# Patient Record
Sex: Male | Born: 1950 | Race: White | Hispanic: No | Marital: Married | Smoking: Heavy tobacco smoker
Health system: Southern US, Community
[De-identification: ages and names within clinical notes are randomized; demographics above are authoritative.]

## PROBLEM LIST (undated history)

## (undated) DIAGNOSIS — R29898 Other symptoms and signs involving the musculoskeletal system: Secondary | ICD-10-CM

## (undated) DIAGNOSIS — M1990 Unspecified osteoarthritis, unspecified site: Secondary | ICD-10-CM

---

## 2016-11-13 ENCOUNTER — Emergency Department (HOSPITAL_COMMUNITY): Payer: Medicare Other

## 2016-11-13 ENCOUNTER — Inpatient Hospital Stay (HOSPITAL_COMMUNITY)
Admission: EM | Admit: 2016-11-13 | Discharge: 2016-11-18 | DRG: 372 | Disposition: A | Discharge status: Home / Self Care | Payer: Medicare Other | Attending: General Surgery | Admitting: General Surgery

## 2016-11-13 ENCOUNTER — Encounter (HOSPITAL_COMMUNITY): Payer: Self-pay | Admitting: Emergency Medicine

## 2016-11-13 DIAGNOSIS — F411 Generalized anxiety disorder: Secondary | ICD-10-CM | POA: Diagnosis present

## 2016-11-13 DIAGNOSIS — K30 Functional dyspepsia: Secondary | ICD-10-CM | POA: Diagnosis not present

## 2016-11-13 DIAGNOSIS — K668 Other specified disorders of peritoneum: Secondary | ICD-10-CM

## 2016-11-13 DIAGNOSIS — K352 Acute appendicitis with generalized peritonitis: Principal | ICD-10-CM | POA: Diagnosis present

## 2016-11-13 DIAGNOSIS — Z79891 Long term (current) use of opiate analgesic: Secondary | ICD-10-CM

## 2016-11-13 DIAGNOSIS — G8929 Other chronic pain: Secondary | ICD-10-CM | POA: Diagnosis present

## 2016-11-13 DIAGNOSIS — N179 Acute kidney failure, unspecified: Secondary | ICD-10-CM | POA: Diagnosis present

## 2016-11-13 DIAGNOSIS — I10 Essential (primary) hypertension: Secondary | ICD-10-CM | POA: Diagnosis present

## 2016-11-13 DIAGNOSIS — R109 Unspecified abdominal pain: Secondary | ICD-10-CM

## 2016-11-13 DIAGNOSIS — K567 Ileus, unspecified: Secondary | ICD-10-CM

## 2016-11-13 DIAGNOSIS — F17210 Nicotine dependence, cigarettes, uncomplicated: Secondary | ICD-10-CM | POA: Diagnosis present

## 2016-11-13 DIAGNOSIS — Z79899 Other long term (current) drug therapy: Secondary | ICD-10-CM

## 2016-11-13 DIAGNOSIS — Z7982 Long term (current) use of aspirin: Secondary | ICD-10-CM

## 2016-11-13 DIAGNOSIS — K219 Gastro-esophageal reflux disease without esophagitis: Secondary | ICD-10-CM | POA: Diagnosis present

## 2016-11-13 DIAGNOSIS — R1084 Generalized abdominal pain: Secondary | ICD-10-CM | POA: Diagnosis present

## 2016-11-13 DIAGNOSIS — K3532 Acute appendicitis with perforation and localized peritonitis, without abscess: Secondary | ICD-10-CM | POA: Diagnosis present

## 2016-11-13 DIAGNOSIS — M545 Low back pain: Secondary | ICD-10-CM | POA: Diagnosis present

## 2016-11-13 DIAGNOSIS — D72829 Elevated white blood cell count, unspecified: Secondary | ICD-10-CM

## 2016-11-13 DIAGNOSIS — Z885 Allergy status to narcotic agent status: Secondary | ICD-10-CM

## 2016-11-13 HISTORY — DX: Unspecified osteoarthritis, unspecified site: M19.90

## 2016-11-13 HISTORY — DX: Other symptoms and signs involving the musculoskeletal system: R29.898

## 2016-11-13 LAB — URINALYSIS, ROUTINE W REFLEX MICROSCOPIC
Glucose, UA: NEGATIVE mg/dL
HGB URINE DIPSTICK: NEGATIVE
Ketones, ur: 5 mg/dL — AB
Leukocytes, UA: NEGATIVE
Nitrite: NEGATIVE
PROTEIN: 100 mg/dL — AB
Specific Gravity, Urine: 1.035 — ABNORMAL HIGH (ref 1.005–1.030)
pH: 5 (ref 5.0–8.0)

## 2016-11-13 LAB — CBC WITH DIFFERENTIAL/PLATELET
BASOS PCT: 0 %
Basophils Absolute: 0 10*3/uL (ref 0.0–0.1)
EOS ABS: 0.1 10*3/uL (ref 0.0–0.7)
Eosinophils Relative: 1 %
HCT: 43.7 % (ref 39.0–52.0)
HEMOGLOBIN: 15.7 g/dL (ref 13.0–17.0)
Lymphocytes Relative: 4 %
Lymphs Abs: 0.9 10*3/uL (ref 0.7–4.0)
MCH: 33 pg (ref 26.0–34.0)
MCHC: 35.9 g/dL (ref 30.0–36.0)
MCV: 91.8 fL (ref 78.0–100.0)
Monocytes Absolute: 1 10*3/uL (ref 0.1–1.0)
Monocytes Relative: 5 %
NEUTROS PCT: 90 %
Neutro Abs: 18.6 10*3/uL — ABNORMAL HIGH (ref 1.7–7.7)
PLATELETS: 258 10*3/uL (ref 150–400)
RBC: 4.76 MIL/uL (ref 4.22–5.81)
RDW: 12.9 % (ref 11.5–15.5)
WBC: 20.6 10*3/uL — AB (ref 4.0–10.5)

## 2016-11-13 LAB — COMPREHENSIVE METABOLIC PANEL
ALK PHOS: 75 U/L (ref 38–126)
ALT: 14 U/L — ABNORMAL LOW (ref 17–63)
ANION GAP: 12 (ref 5–15)
AST: 17 U/L (ref 15–41)
Albumin: 3.9 g/dL (ref 3.5–5.0)
BILIRUBIN TOTAL: 0.9 mg/dL (ref 0.3–1.2)
BUN: 15 mg/dL (ref 6–20)
CALCIUM: 9.3 mg/dL (ref 8.9–10.3)
CO2: 27 mmol/L (ref 22–32)
Chloride: 97 mmol/L — ABNORMAL LOW (ref 101–111)
Creatinine, Ser: 1.28 mg/dL — ABNORMAL HIGH (ref 0.61–1.24)
GFR calc non Af Amer: 57 mL/min — ABNORMAL LOW (ref >60)
Glucose, Bld: 138 mg/dL — ABNORMAL HIGH (ref 65–99)
Potassium: 3.5 mmol/L (ref 3.5–5.1)
SODIUM: 136 mmol/L (ref 135–145)
TOTAL PROTEIN: 8.7 g/dL — AB (ref 6.5–8.1)

## 2016-11-13 LAB — TROPONIN I: Troponin I: <0.03 ng/mL (ref <0.03)

## 2016-11-13 LAB — LIPASE, BLOOD: LIPASE: 13 U/L (ref 11–51)

## 2016-11-13 MED ORDER — SODIUM CHLORIDE 0.9 % IV SOLN: INTRAVENOUS | Status: DC

## 2016-11-13 MED ORDER — ONDANSETRON HCL 4 MG/2ML IJ SOLN
4.0000 mg | INTRAMUSCULAR | Status: AC | PRN
  Administered 2016-11-13 – 2016-11-14 (×2): 4 mg via INTRAVENOUS
  Filled 2016-11-13 (×2): qty 2

## 2016-11-13 MED ORDER — IOPAMIDOL (ISOVUE-300) INJECTION 61%
100.0000 mL | Freq: Once | INTRAVENOUS | Status: AC | PRN
  Administered 2016-11-13: 100 mL via INTRAVENOUS

## 2016-11-13 MED ORDER — IOPAMIDOL (ISOVUE-300) INJECTION 61%
INTRAVENOUS | Status: AC
  Filled 2016-11-13: qty 30

## 2016-11-13 MED ORDER — SODIUM CHLORIDE 0.9 % IV BOLUS (SEPSIS)
500.0000 mL | Freq: Once | INTRAVENOUS | Status: AC
  Administered 2016-11-13: 500 mL via INTRAVENOUS

## 2016-11-13 MED ORDER — FENTANYL CITRATE (PF) 100 MCG/2ML IJ SOLN
50.0000 ug | INTRAMUSCULAR | Status: AC | PRN
  Administered 2016-11-13 – 2016-11-14 (×2): 50 ug via INTRAVENOUS
  Filled 2016-11-13 (×2): qty 2

## 2016-11-13 NOTE — ED Notes (Addendum)
Patient states that he has chronic neck and back pain. Patient states that he is not able to void at this time.

## 2016-11-13 NOTE — ED Triage Notes (Signed)
abd pain since weds- He states that he is on pain meds

## 2016-11-13 NOTE — ED Provider Notes (Signed)
[facility] DEPT Provider Note   CSN: [id number] Arrival date & time: 11/13/16  2032     History   Chief Complaint Chief Complaint  Patient presents with  . Abdominal Pain    "all the way across and all the way up"    HPI Garcia Dalzell is a 66 y.o. male.   Abdominal Pain      Pt was seen at 2145.  Per pt, c/o gradual onset and persistence of constant generalized abd "pain" for the past 3 days.  Has been associated with decreased PO intake.  Describes the abd pain as "bloating."  Denies N/V, no diarrhea, no fevers, no back pain, no rash, no CP/SOB, no black or blood in stools.        Past Medical History:  Diagnosis Date  . Arthritis   . Back complaints     There are no active problems to display for this patient.   History reviewed. No pertinent surgical history.     Home Medications    Prior to Admission medications   Medication Sig Start Date End Date Taking? Authorizing Provider  ALPRAZolam Prudy Feeler) 0.5 MG tablet Take 0.5 mg by mouth 4 (four) times daily.   Yes Historical Provider, MD  amLODipine (NORVASC) 10 MG tablet Take 5 mg by mouth daily.   Yes Historical Provider, MD  aspirin EC 81 MG tablet Take 81 mg by mouth daily.   Yes Historical Provider, MD  cholecalciferol (VITAMIN D) 1000 units tablet Take 2,000 Units by mouth daily.   Yes Historical Provider, MD  cyclobenzaprine (FLEXERIL) 10 MG tablet Take 10 mg by mouth 2 (two) times daily.   Yes Historical Provider, MD  docusate sodium (COLACE) 100 MG capsule Take 100-300 mg by mouth daily as needed for mild constipation.   Yes Historical Provider, MD  DULoxetine (CYMBALTA) 60 MG capsule Take 60 mg by mouth daily.   Yes Historical Provider, MD  L-Lysine 500 MG TABS Take 1 tablet by mouth daily.   Yes Historical Provider, MD  morphine (KADIAN) 60 MG 24 hr capsule Take 60 mg by mouth 3 (three) times daily.   Yes Historical Provider, MD  omeprazole (PRILOSEC) 20 MG capsule Take 20 mg by mouth 2 (two)  times daily before a meal.   Yes Historical Provider, MD  oxyCODONE-acetaminophen (PERCOCET/ROXICET) 5-325 MG tablet Take by mouth every 6 (six) hours as needed for severe pain.   Yes Historical Provider, MD    Family History No family history on file.  Social History Social History  Substance Use Topics  . Smoking status: Heavy Tobacco Smoker    Packs/day: 1.00    Types: Cigarettes  . Smokeless tobacco: Never Used  . Alcohol use Yes     Allergies   Codeine   Review of Systems Review of Systems  Gastrointestinal: Positive for abdominal pain.  ROS: Statement: All systems negative except as marked or noted in the HPI; Constitutional: Negative for fever and chills. ; ; Eyes: Negative for eye pain, redness and discharge. ; ; ENMT: Negative for ear pain, hoarseness, nasal congestion, sinus pressure and sore throat. ; ; Cardiovascular: Negative for chest pain, palpitations, diaphoresis, dyspnea and peripheral edema. ; ; Respiratory: Negative for cough, wheezing and stridor. ; ; Gastrointestinal: +abd pain. Negative for nausea, vomiting, diarrhea, blood in stool, hematemesis, jaundice and rectal bleeding. . ; ; Genitourinary: Negative for dysuria, flank pain and hematuria. ; ; Musculoskeletal: Negative for change in chronic back pain and neck pain. Negative for swelling  and trauma.; ; Skin: Negative for pruritus, rash, abrasions, blisters, bruising and skin lesion.; ; Neuro: Negative for headache, lightheadedness and neck stiffness. Negative for weakness, altered level of consciousness, altered mental status, extremity weakness, paresthesias, involuntary movement, seizure and syncope.      Physical Exam Updated Vital Signs BP 145/80 (BP Location: Left Arm)   Pulse 100   Temp 99 F (37.2 C) (Oral)   Resp 18   Ht 5\' 11"  (1.803 m)   Wt 210 lb (95.3 kg)   SpO2 92%   BMI 29.29 kg/m   Physical Exam 2150: Physical examination:  Nursing notes reviewed; Vital signs and O2 SAT reviewed;   Constitutional: Well developed, Well nourished, Well hydrated, Uncomfortable appearing; Head:  Normocephalic, atraumatic; Eyes: EOMI, PERRL, No scleral icterus; ENMT: Mouth and pharynx normal, Mucous membranes moist; Neck: Supple, Full range of motion, No lymphadenopathy; Cardiovascular: Regular rate and rhythm, No gallop; Respiratory: Breath sounds clear & equal bilaterally, No wheezes.  Speaking full sentences with ease, Normal respiratory effort/excursion; Chest: Nontender, Movement normal; Abdomen: Soft, +diffuse tenderness to palp. +voluntary guarding. Nondistended, Normal bowel sounds; Genitourinary: No CVA tenderness; Extremities: Pulses normal, No tenderness, No edema, No calf edema or asymmetry.; Neuro: AA&Ox3, Major CN grossly intact.  Speech clear. No gross focal motor or sensory deficits in extremities.; Skin: Color normal, Warm, Dry.   ED Treatments / Results  Labs (all labs ordered are listed, but only abnormal results are displayed)   EKG  EKG Interpretation  Date/Time:  Saturday November 13 2016 21:59:45 EST Ventricular Rate:  98 PR Interval:    QRS Duration: 95 QT Interval:  320 QTC Calculation: 409 R Axis:   70 Text Interpretation:  Sinus rhythm Consider right atrial enlargement No old tracing to compare Confirmed by [facility]  MD, Nicholos Johns [phone]) on 11/13/2016 10:18:40 PM       Radiology   Procedures Procedures (including critical care time)  Medications Ordered in ED Medications  ondansetron (ZOFRAN) injection 4 mg (not administered)  fentaNYL (SUBLIMAZE) injection 50 mcg (not administered)  sodium chloride 0.9 % bolus 500 mL (not administered)  0.9 %  sodium chloride infusion (not administered)     Initial Impression / Assessment and Plan / ED Course  I have reviewed the triage vital signs and the nursing notes.  Pertinent labs & imaging results that were available during my care of the patient were reviewed by me and considered in my medical decision making  (see chart for details).  MDM Reviewed: previous chart, nursing note and vitals Reviewed previous: labs Interpretation: labs, ECG, x-ray and CT scan   Results for orders placed or performed during the hospital encounter of 11/13/16  Comprehensive metabolic panel  Result Value Ref Range   Sodium 136 135 - 145 mmol/L   Potassium 3.5 3.5 - 5.1 mmol/L   Chloride 97 (L) 101 - 111 mmol/L   CO2 27 22 - 32 mmol/L   Glucose, Bld 138 (H) 65 - 99 mg/dL   BUN 15 6 - 20 mg/dL   Creatinine, Ser 6.04 (H) 0.61 - 1.24 mg/dL   Calcium 9.3 8.9 - 54.0 mg/dL   Total Protein 8.7 (H) 6.5 - 8.1 g/dL   Albumin 3.9 3.5 - 5.0 g/dL   AST 17 15 - 41 U/L   ALT 14 (L) 17 - 63 U/L   Alkaline Phosphatase 75 38 - 126 U/L   Total Bilirubin 0.9 0.3 - 1.2 mg/dL   GFR calc non Af Amer 57 (L) >60 mL/min  GFR calc Af Amer >60 >60 mL/min   Anion gap 12 5 - 15  Lipase, blood  Result Value Ref Range   Lipase 13 11 - 51 U/L  CBC with Differential  Result Value Ref Range   WBC 20.6 (H) 4.0 - 10.5 K/uL   RBC 4.76 4.22 - 5.81 MIL/uL   Hemoglobin 15.7 13.0 - 17.0 g/dL   HCT 40.9 81.1 - 91.4 %   MCV 91.8 78.0 - 100.0 fL   MCH 33.0 26.0 - 34.0 pg   MCHC 35.9 30.0 - 36.0 g/dL   RDW 78.2 95.6 - 21.3 %   Platelets 258 150 - 400 K/uL   Neutrophils Relative % 90 %   Neutro Abs 18.6 (H) 1.7 - 7.7 K/uL   Lymphocytes Relative 4 %   Lymphs Abs 0.9 0.7 - 4.0 K/uL   Monocytes Relative 5 %   Monocytes Absolute 1.0 0.1 - 1.0 K/uL   Eosinophils Relative 1 %   Eosinophils Absolute 0.1 0.0 - 0.7 K/uL   Basophils Relative 0 %   Basophils Absolute 0.0 0.0 - 0.1 K/uL  Troponin I  Result Value Ref Range   Troponin I <0.03 <0.03 ng/mL  Urinalysis, Routine w reflex microscopic  Result Value Ref Range   Color, Urine AMBER (A) YELLOW   APPearance HAZY (A) CLEAR   Specific Gravity, Urine 1.035 (H) 1.005 - 1.030   pH 5.0 5.0 - 8.0   Glucose, UA NEGATIVE NEGATIVE mg/dL   Hgb urine dipstick NEGATIVE NEGATIVE   Bilirubin Urine  SMALL (A) NEGATIVE   Ketones, ur 5 (A) NEGATIVE mg/dL   Protein, ur 086 (A) NEGATIVE mg/dL   Nitrite NEGATIVE NEGATIVE   Leukocytes, UA NEGATIVE NEGATIVE   RBC / HPF 0-5 0 - 5 RBC/hpf   WBC, UA 0-5 0 - 5 WBC/hpf   Bacteria, UA RARE (A) NONE SEEN   Mucous PRESENT    Granular Casts, UA PRESENT    Dg Chest 2 View Result Date: 11/13/2016 CLINICAL DATA:  Abdominal pain extending into chest 3 days. EXAM: CHEST  2 VIEW COMPARISON:  None. FINDINGS: Lungs are adequately inflated without focal consolidation or effusion. Cardiomediastinal silhouette is within normal. There is mild degenerate change of the spine. IMPRESSION: No active cardiopulmonary disease. Electronically Signed   By: Elberta Fortis M.D.   On: 11/13/2016 21:59   Ct Abdomen Pelvis W Contrast Result Date: 11/14/2016 CLINICAL DATA:  66 year old male with abdominal pain. EXAM: CT ABDOMEN AND PELVIS WITH CONTRAST TECHNIQUE: Multidetector CT imaging of the abdomen and pelvis was performed using the standard protocol following bolus administration of intravenous contrast. CONTRAST:  ISOVUE-300 IOPAMIDOL (ISOVUE-300) INJECTION 61% COMPARISON:  None. FINDINGS: Lower chest: The visualized lung bases are clear. Scattered pockets of free air noted in the upper abdomen and within the pelvis. Small subhepatic free fluid. Hepatobiliary: Apparent fatty infiltration of the liver. No intrahepatic biliary ductal dilatation. The gallbladder is distended. No calcified gallstones noted. Pancreas: Unremarkable. No pancreatic ductal dilatation or surrounding inflammatory changes. Spleen: Normal in size without focal abnormality. Adrenals/Urinary Tract: The adrenal glands, kidneys, and the visualized ureters appear unremarkable. The urinary bladder is predominantly collapsed. There is diffuse thickened and hazy appearance of the bladder wall likely combination of underdistention and reactive inflammation. Correlation with urinalysis recommended to exclude  cystitis. Stomach/Bowel: There is inflammatory changes with pockets of extraluminal air centered in the right lower quadrant in the region of the appendix. The appendix is only partially visualized. The base and proximal  portion of the appendix appear unremarkable. The distal portion of the appendix extends into the inflammatory process cannot well visualized. The constellation of findings most suspicious for a ruptured acute appendicitis. Multiple small loculated fluid collections noted in the right lower quadrant. No discrete drainable fluid collection with organized wall identified at this time. Thickened and inflamed loops of small bowel in the right hemipelvis likely reactive inflammation. Multiple mildly dilated fluid-filled loops of small bowel throughout the abdomen most consistent with ileus. There is sigmoid diverticulosis with muscular hypertrophy. No active inflammatory changes. No definite evidence of bowel obstruction Vascular/Lymphatic: There is mild aortoiliac atherosclerotic disease. The origins of the celiac axis, SMA, IMA as well as the origins of the renal arteries are patent. The SMV, splenic vein, and main portal vein are patent. No portal venous gas identified. Mildly enlarged peripancreatic and periportal lymph nodes, nonspecific, likely reactive. Reproductive: The prostate and seminal vesicles are grossly unremarkable. Other: Small fat containing umbilical hernia. Musculoskeletal: Bilateral L5 pars defects with grade 1 L5-S1 anterolisthesis. Mild multilevel degenerative changes. No acute fracture. IMPRESSION: Findings most consistent with perforated acute appendicitis with pneumoperitoneum and small free fluid. Multiple small loculated fluid collection in the region of the appendix noted. No drainable fluid collection identified at this time. Clinical correlation follow-up recommended. Thickening of the small bowel in the right lower abdomen as well as mild dilatation of the loops of small  bowel most consistent with reactive enteritis and ileus. Sigmoid diverticulosis. These results were called by telephone at the time of interpretation on 11/14/2016 at 12:05 am to Dr. Samuel JesterKATHLEEN Hali Balgobin , who verbally acknowledged these results. Electronically Signed   By: Elgie CollardArash  Radparvar M.D.   On: 11/14/2016 00:07     0020:   CT scan as above. IV zosyn ordered. Dx and testing d/w pt and family.  Questions answered.  Verb understanding, agreeable to admit. T/C to General Surgery Dr. Lovell SheehanJenkins, case discussed, including:  HPI, pertinent PM/SHx, VS/PE, dx testing, ED course and treatment:  Pt does not need to go to OR emergently, start IV abx, admit to [facility], will consult. T/C to [facility] Dr. Onalee Huaavid, case discussed, including:  HPI, pertinent PM/SHx, VS/PE, dx testing, ED course and treatment:  Agreeable to admit, requests to write temporary orders, obtain medical bed to team [facility].   Final Clinical Impressions(s) / ED Diagnoses   Final diagnoses:  None    New Prescriptions New Prescriptions   No medications on file      Samuel JesterKathleen Jacarra Bobak, DO 11/16/16 1210

## 2016-11-14 DIAGNOSIS — F17210 Nicotine dependence, cigarettes, uncomplicated: Secondary | ICD-10-CM | POA: Diagnosis present

## 2016-11-14 DIAGNOSIS — G8929 Other chronic pain: Secondary | ICD-10-CM | POA: Diagnosis present

## 2016-11-14 DIAGNOSIS — K219 Gastro-esophageal reflux disease without esophagitis: Secondary | ICD-10-CM | POA: Diagnosis present

## 2016-11-14 DIAGNOSIS — Z885 Allergy status to narcotic agent status: Secondary | ICD-10-CM | POA: Diagnosis not present

## 2016-11-14 DIAGNOSIS — N179 Acute kidney failure, unspecified: Secondary | ICD-10-CM | POA: Diagnosis not present

## 2016-11-14 DIAGNOSIS — D72829 Elevated white blood cell count, unspecified: Secondary | ICD-10-CM | POA: Diagnosis not present

## 2016-11-14 DIAGNOSIS — K3532 Acute appendicitis with perforation and localized peritonitis, without abscess: Secondary | ICD-10-CM | POA: Diagnosis present

## 2016-11-14 DIAGNOSIS — K30 Functional dyspepsia: Secondary | ICD-10-CM | POA: Diagnosis not present

## 2016-11-14 DIAGNOSIS — F411 Generalized anxiety disorder: Secondary | ICD-10-CM | POA: Diagnosis present

## 2016-11-14 DIAGNOSIS — K352 Acute appendicitis with generalized peritonitis: Principal | ICD-10-CM

## 2016-11-14 DIAGNOSIS — R1084 Generalized abdominal pain: Secondary | ICD-10-CM | POA: Diagnosis present

## 2016-11-14 DIAGNOSIS — M545 Low back pain: Secondary | ICD-10-CM | POA: Diagnosis present

## 2016-11-14 DIAGNOSIS — Z79899 Other long term (current) drug therapy: Secondary | ICD-10-CM | POA: Diagnosis not present

## 2016-11-14 DIAGNOSIS — Z7982 Long term (current) use of aspirin: Secondary | ICD-10-CM | POA: Diagnosis not present

## 2016-11-14 DIAGNOSIS — I10 Essential (primary) hypertension: Secondary | ICD-10-CM | POA: Diagnosis present

## 2016-11-14 DIAGNOSIS — Z79891 Long term (current) use of opiate analgesic: Secondary | ICD-10-CM | POA: Diagnosis not present

## 2016-11-14 LAB — BASIC METABOLIC PANEL
Anion gap: 10 (ref 5–15)
BUN: 14 mg/dL (ref 6–20)
CHLORIDE: 98 mmol/L — AB (ref 101–111)
CO2: 26 mmol/L (ref 22–32)
CREATININE: 1.12 mg/dL (ref 0.61–1.24)
Calcium: 8.6 mg/dL — ABNORMAL LOW (ref 8.9–10.3)
GFR calc Af Amer: >60 mL/min (ref >60)
GFR calc non Af Amer: >60 mL/min (ref >60)
GLUCOSE: 127 mg/dL — AB (ref 65–99)
POTASSIUM: 3.8 mmol/L (ref 3.5–5.1)
SODIUM: 134 mmol/L — AB (ref 135–145)

## 2016-11-14 LAB — CBC
HEMATOCRIT: 38.9 % — AB (ref 39.0–52.0)
Hemoglobin: 14.1 g/dL (ref 13.0–17.0)
MCH: 33.4 pg (ref 26.0–34.0)
MCHC: 36.2 g/dL — AB (ref 30.0–36.0)
MCV: 92.2 fL (ref 78.0–100.0)
PLATELETS: 235 10*3/uL (ref 150–400)
RBC: 4.22 MIL/uL (ref 4.22–5.81)
RDW: 13 % (ref 11.5–15.5)
WBC: 16 10*3/uL — AB (ref 4.0–10.5)

## 2016-11-14 LAB — PROTIME-INR
INR: 1.11
Prothrombin Time: 14.4 seconds (ref 11.4–15.2)

## 2016-11-14 LAB — SURGICAL PCR SCREEN
MRSA, PCR: NEGATIVE
Staphylococcus aureus: NEGATIVE

## 2016-11-14 LAB — LACTIC ACID, PLASMA
Lactic Acid, Venous: 1 mmol/L (ref 0.5–1.9)
Lactic Acid, Venous: 1 mmol/L (ref 0.5–1.9)

## 2016-11-14 MED ORDER — LACTATED RINGERS IV SOLN
INTRAVENOUS | Status: DC
  Administered 2016-11-14: 125 mL/h via INTRAVENOUS
  Administered 2016-11-15: 22:00:00 via INTRAVENOUS

## 2016-11-14 MED ORDER — ONDANSETRON HCL 4 MG/2ML IJ SOLN: 4.0000 mg | Freq: Three times a day (TID) | INTRAMUSCULAR | Status: AC | PRN

## 2016-11-14 MED ORDER — ONDANSETRON HCL 4 MG PO TABS: 4.0000 mg | ORAL_TABLET | Freq: Four times a day (QID) | ORAL | Status: DC | PRN

## 2016-11-14 MED ORDER — NICOTINE 21 MG/24HR TD PT24
21.0000 mg | MEDICATED_PATCH | Freq: Every day | TRANSDERMAL | Status: DC
  Administered 2016-11-14 – 2016-11-18 (×5): 21 mg via TRANSDERMAL
  Filled 2016-11-14 (×5): qty 1

## 2016-11-14 MED ORDER — HYDROMORPHONE HCL 1 MG/ML IJ SOLN
1.0000 mg | INTRAMUSCULAR | Status: DC | PRN
  Administered 2016-11-14 – 2016-11-15 (×7): 1 mg via INTRAVENOUS
  Filled 2016-11-14 (×7): qty 1

## 2016-11-14 MED ORDER — PIPERACILLIN-TAZOBACTAM 3.375 G IVPB
3.3750 g | Freq: Three times a day (TID) | INTRAVENOUS | Status: DC
  Administered 2016-11-14 – 2016-11-18 (×12): 3.375 g via INTRAVENOUS
  Filled 2016-11-14 (×10): qty 50

## 2016-11-14 MED ORDER — LORAZEPAM 2 MG/ML IJ SOLN
1.0000 mg | INTRAMUSCULAR | Status: DC | PRN
  Administered 2016-11-14 – 2016-11-17 (×9): 1 mg via INTRAVENOUS
  Filled 2016-11-14 (×9): qty 1

## 2016-11-14 MED ORDER — ONDANSETRON HCL 4 MG/2ML IJ SOLN
4.0000 mg | Freq: Four times a day (QID) | INTRAMUSCULAR | Status: DC | PRN
  Administered 2016-11-15 (×2): 4 mg via INTRAVENOUS
  Filled 2016-11-14 (×2): qty 2

## 2016-11-14 MED ORDER — MORPHINE SULFATE (PF) 4 MG/ML IV SOLN
4.0000 mg | INTRAVENOUS | Status: DC | PRN
  Administered 2016-11-14 (×2): 4 mg via INTRAVENOUS
  Filled 2016-11-14 (×3): qty 1

## 2016-11-14 MED ORDER — PIPERACILLIN-TAZOBACTAM 3.375 G IVPB 30 MIN
3.3750 g | Freq: Once | INTRAVENOUS | Status: AC
  Administered 2016-11-14: 3.375 g via INTRAVENOUS
  Filled 2016-11-14: qty 50

## 2016-11-14 MED ORDER — SODIUM CHLORIDE 0.9 % IV SOLN: INTRAVENOUS | Status: AC

## 2016-11-14 NOTE — H&P (Signed)
History and Physical    Daniel [name] ZOX:[id number] DOB: 11/04/1950 DOA: 11/13/2016  PCP: [facility]  Patient coming from: home  Chief Complaint:  Abdominal pain  HPI: Daniel [name] is a 66 y.o. male with medical history significant of chronic back pain, arthritis comes in with 3-4 days of abdominal pain.  Pt reports the pain was really bad at first, but then seemed to get better for a day or so then got worse again.  The pain is generalized and is worse with any movement.  He denies any fevers.  No n/v/d.  He thought he was constipated so he gave himself an enema yesterday and although he did have a BM it did not make him feel better.  Pt found to have perforated appendix.  Dr. Richrd Prime in ED called general surgery dr Lovell Sheehan and discussed case and will see pt in the am.    Review of Systems: As per HPI otherwise 10 point review of systems negative.   Past Medical History:  Diagnosis Date  . Arthritis   . Back complaints     History reviewed. No pertinent surgical history.   reports that he has been smoking Cigarettes.  He has been smoking about 1.00 pack per day. He has never used smokeless tobacco. He reports that he drinks alcohol. He reports that he does not use drugs.  Allergies  Allergen Reactions  . Codeine Hives    No family history on file. no premature CAD  Prior to Admission medications   Medication Sig Start Date End Date Taking? Authorizing Provider  ALPRAZolam Prudy Feeler) 0.5 MG tablet Take 0.5 mg by mouth 4 (four) times daily.   Yes Historical Provider, MD  amLODipine (NORVASC) 10 MG tablet Take 5 mg by mouth daily.   Yes Historical Provider, MD  aspirin EC 81 MG tablet Take 81 mg by mouth daily.   Yes Historical Provider, MD  cholecalciferol (VITAMIN D) 1000 units tablet Take 2,000 Units by mouth daily.   Yes Historical Provider, MD  cyclobenzaprine (FLEXERIL) 10 MG tablet Take 10 mg by mouth 2 (two) times daily.   Yes Historical Provider, MD    docusate sodium (COLACE) 100 MG capsule Take 100-300 mg by mouth daily as needed for mild constipation.   Yes Historical Provider, MD  DULoxetine (CYMBALTA) 60 MG capsule Take 60 mg by mouth daily.   Yes Historical Provider, MD  L-Lysine 500 MG TABS Take 1 tablet by mouth daily.   Yes Historical Provider, MD  morphine (KADIAN) 60 MG 24 hr capsule Take 60 mg by mouth 3 (three) times daily.   Yes Historical Provider, MD  omeprazole (PRILOSEC) 20 MG capsule Take 20 mg by mouth 2 (two) times daily before a meal.   Yes Historical Provider, MD  oxyCODONE-acetaminophen (PERCOCET/ROXICET) 5-325 MG tablet Take by mouth every 6 (six) hours as needed for severe pain.   Yes Historical Provider, MD    Physical Exam: Vitals:   11/13/16 2201 11/13/16 2215 11/13/16 2230 11/14/16 0029  BP: 145/80  126/78   Pulse: 100 96  110  Resp: 18 18 21 20   Temp:    99.7 F (37.6 C)  TempSrc:    Oral  SpO2: 92% 94%  96%  Weight:      Height:        Constitutional: NAD, calm, comfortable Vitals:   11/13/16 2201 11/13/16 2215 11/13/16 2230 11/14/16 0029  BP: 145/80  126/78   Pulse: 100 96  110  Resp: 18  18 21 20   Temp:    99.7 F (37.6 C)  TempSrc:    Oral  SpO2: 92% 94%  96%  Weight:      Height:       Eyes: PERRL, lids and conjunctivae normal ENMT: Mucous membranes are moist. Posterior pharynx clear of any exudate or lesions.Normal dentition.  Neck: normal, supple, no masses, no thyromegaly Respiratory: clear to auscultation bilaterally, no wheezing, no crackles. Normal respiratory effort. No accessory muscle use.  Cardiovascular: Regular rate and rhythm, no murmurs / rubs / gallops. No extremity edema. 2+ pedal pulses. No carotid bruits.  Abdomen: diffuse tenderness with rebound and guarding, no masses palpated. No hepatosplenomegaly. Bowel sounds decreased.  Musculoskeletal: no clubbing / cyanosis. No joint deformity upper and lower extremities. Good ROM, no contractures. Normal muscle tone.  Skin:  no rashes, lesions, ulcers. No induration Neurologic: CN 2-12 grossly intact. Sensation intact, DTR normal. Strength 5/5 in all 4.  Psychiatric: Normal judgment and insight. Alert and oriented x 3. Normal mood.    Labs on Admission: I have personally reviewed following labs and imaging studies  CBC:  Recent Labs Lab 11/13/16 2136  WBC 20.6*  NEUTROABS 18.6*  HGB 15.7  HCT 43.7  MCV 91.8  PLT 258   Basic Metabolic Panel:  Recent Labs Lab 11/13/16 2136  NA 136  K 3.5  CL 97*  CO2 27  GLUCOSE 138*  BUN 15  CREATININE 1.28*  CALCIUM 9.3   GFR: Estimated Creatinine Clearance: 67.8 mL/min (by C-G formula based on SCr of 1.28 mg/dL (H)). Liver Function Tests:  Recent Labs Lab 11/13/16 2136  AST 17  ALT 14*  ALKPHOS 75  BILITOT 0.9  PROT 8.7*  ALBUMIN 3.9    Recent Labs Lab 11/13/16 2136  LIPASE 13    Cardiac Enzymes:  Recent Labs Lab 11/13/16 2136  TROPONINI <0.03    Urine analysis:    Component Value Date/Time   COLORURINE AMBER (A) 11/13/2016 2328   APPEARANCEUR HAZY (A) 11/13/2016 2328   LABSPEC 1.035 (H) 11/13/2016 2328   PHURINE 5.0 11/13/2016 2328   GLUCOSEU NEGATIVE 11/13/2016 2328   HGBUR NEGATIVE 11/13/2016 2328   BILIRUBINUR SMALL (A) 11/13/2016 2328   KETONESUR 5 (A) 11/13/2016 2328   PROTEINUR 100 (A) 11/13/2016 2328   NITRITE NEGATIVE 11/13/2016 2328   LEUKOCYTESUR NEGATIVE 11/13/2016 2328     Radiological Exams on Admission: Dg Chest 2 View  Result Date: 11/13/2016 CLINICAL DATA:  Abdominal pain extending into chest 3 days. EXAM: CHEST  2 VIEW COMPARISON:  None. FINDINGS: Lungs are adequately inflated without focal consolidation or effusion. Cardiomediastinal silhouette is within normal. There is mild degenerate change of the spine. IMPRESSION: No active cardiopulmonary disease. Electronically Signed   By: Elberta Fortisaniel  Boyle M.D.   On: 11/13/2016 21:59   Ct Abdomen Pelvis W Contrast  Result Date: 11/14/2016 CLINICAL DATA:   66 year old male with abdominal pain. EXAM: CT ABDOMEN AND PELVIS WITH CONTRAST TECHNIQUE: Multidetector CT imaging of the abdomen and pelvis was performed using the standard protocol following bolus administration of intravenous contrast. CONTRAST:  100mL ISOVUE-300 IOPAMIDOL (ISOVUE-300) INJECTION 61% COMPARISON:  None. FINDINGS: Lower chest: The visualized lung bases are clear. Scattered pockets of free air noted in the upper abdomen and within the pelvis. Small subhepatic free fluid. Hepatobiliary: Apparent fatty infiltration of the liver. No intrahepatic biliary ductal dilatation. The gallbladder is distended. No calcified gallstones noted. Pancreas: Unremarkable. No pancreatic ductal dilatation or surrounding inflammatory changes. Spleen: Normal in size  without focal abnormality. Adrenals/Urinary Tract: The adrenal glands, kidneys, and the visualized ureters appear unremarkable. The urinary bladder is predominantly collapsed. There is diffuse thickened and hazy appearance of the bladder wall likely combination of underdistention and reactive inflammation. Correlation with urinalysis recommended to exclude cystitis. Stomach/Bowel: There is inflammatory changes with pockets of extraluminal air centered in the right lower quadrant in the region of the appendix. The appendix is only partially visualized. The base and proximal portion of the appendix appear unremarkable. The distal portion of the appendix extends into the inflammatory process cannot well visualized. The constellation of findings most suspicious for a ruptured acute appendicitis. Multiple small loculated fluid collections noted in the right lower quadrant. No discrete drainable fluid collection with organized wall identified at this time. Thickened and inflamed loops of small bowel in the right hemipelvis likely reactive inflammation. Multiple mildly dilated fluid-filled loops of small bowel throughout the abdomen most consistent with ileus. There  is sigmoid diverticulosis with muscular hypertrophy. No active inflammatory changes. No definite evidence of bowel obstruction Vascular/Lymphatic: There is mild aortoiliac atherosclerotic disease. The origins of the celiac axis, SMA, IMA as well as the origins of the renal arteries are patent. The SMV, splenic vein, and main portal vein are patent. No portal venous gas identified. Mildly enlarged peripancreatic and periportal lymph nodes, nonspecific, likely reactive. Reproductive: The prostate and seminal vesicles are grossly unremarkable. Other: Small fat containing umbilical hernia. Musculoskeletal: Bilateral L5 pars defects with grade 1 L5-S1 anterolisthesis. Mild multilevel degenerative changes. No acute fracture. IMPRESSION: Findings most consistent with perforated acute appendicitis with pneumoperitoneum and small free fluid. Multiple small loculated fluid collection in the region of the appendix noted. No drainable fluid collection identified at this time. Clinical correlation follow-up recommended. Thickening of the small bowel in the right lower abdomen as well as mild dilatation of the loops of small bowel most consistent with reactive enteritis and ileus. Sigmoid diverticulosis. These results were called by telephone at the time of interpretation on 11/14/2016 at 12:05 am to Dr. Samuel Jester , who verbally acknowledged these results. Electronically Signed   By: Elgie Collard M.D.   On: 11/14/2016 00:07    EKG: Independently reviewed. nsr no acute issues cxr reviewed no edema or infiltrate Case discussed with dr Richrd Prime  Assessment/Plan 66 yo male with acute perforated appendix  Principal Problem:   Acute perforated appendicitis- place on iv zosyn.  Keep npo and hold anticoagulation for surgery later today.  Dr Lovell Sheehan aware of case already.  Pt currently stable, prn pain meds ordered.  Active Problems:   AKI (acute kidney injury) (HCC)- due to above, LR overnight   Abdominal pain,  acute, generalized- noted    DVT prophylaxis:   scds Code Status:  Full code Family Communication:   none Disposition Plan:    Per day team Consults called:   General surgery dr Lovell Sheehan Admission status:   admission   Skyy Nilan A MD [facility]  If 7PM-7AM, please contact night-coverage [email or web address] Password [facility]  11/14/2016, 12:45 AM

## 2016-11-14 NOTE — Consult Note (Signed)
Reason for Consult: Perforated appendicitis Referring Physician: Dr. June [name] [name] is an 67 y.o. male.  HPI: Patient is a 66 year old white male who presented with worsening lower abdominal pain. He denies he has never had this before. The pain was getting more severe. The patient states she takes multiple narcotics for lower back pain. The pain had been going on for 4 days. Mild nausea was noted. No vomiting was noted. Decreased appetite noted. CT scan of the abdomen yesterday evening revealed a contained perforated appendicitis without abscess formation. He was admitted to the hospital for IV antibiotics.  Past Medical History:  Diagnosis Date  . Arthritis   . Back complaints     History reviewed. No pertinent surgical history.  No family history on file.  Social History:  reports that he has been smoking Cigarettes.  He has been smoking about 1.00 pack per day. He has never used smokeless tobacco. He reports that he drinks alcohol. He reports that he does not use drugs.  Allergies:  Allergies  Allergen Reactions  . Codeine Hives    Medications:  Prior to Admission:  Prescriptions Prior to Admission  Medication Sig Dispense Refill Last Dose  . ALPRAZolam (XANAX) 0.5 MG tablet Take 0.5 mg by mouth 4 (four) times daily.   11/12/2016 at Unknown time  . amLODipine (NORVASC) 10 MG tablet Take 5 mg by mouth daily.   11/13/2016 at Unknown time  . aspirin EC 81 MG tablet Take 81 mg by mouth daily.   11/13/2016 at Unknown time  . cholecalciferol (VITAMIN D) 1000 units tablet Take 2,000 Units by mouth daily.   11/13/2016 at Unknown time  . cyclobenzaprine (FLEXERIL) 10 MG tablet Take 10 mg by mouth 2 (two) times daily.   11/13/2016 at Unknown time  . docusate sodium (COLACE) 100 MG capsule Take 100-300 mg by mouth daily as needed for mild constipation.   11/13/2016 at Unknown time  . DULoxetine (CYMBALTA) 60 MG capsule Take 60 mg by mouth daily.   11/13/2016 at Unknown time  .  L-Lysine 500 MG TABS Take 1 tablet by mouth daily.   11/13/2016 at Unknown time  . morphine (KADIAN) 60 MG 24 hr capsule Take 60 mg by mouth 3 (three) times daily.   11/12/2016 at Unknown time  . omeprazole (PRILOSEC) 20 MG capsule Take 20 mg by mouth 2 (two) times daily before a meal.   11/13/2016 at Unknown time  . oxyCODONE-acetaminophen (PERCOCET/ROXICET) 5-325 MG tablet Take by mouth every 6 (six) hours as needed for severe pain.   11/12/2016 at Unknown time   Scheduled: . sodium chloride   Intravenous STAT  . iopamidol      . piperacillin-tazobactam (ZOSYN)  IV  3.375 g Intravenous Q8H    Results for orders placed or performed during the hospital encounter of 11/13/16 (from the past 48 hour(s))  Comprehensive metabolic panel     Status: Abnormal   Collection Time: 11/13/16  9:36 PM  Result Value Ref Range   Sodium 136 135 - 145 mmol/L   Potassium 3.5 3.5 - 5.1 mmol/L   Chloride 97 (L) 101 - 111 mmol/L   CO2 27 22 - 32 mmol/L   Glucose, Bld 138 (H) 65 - 99 mg/dL   BUN 15 6 - 20 mg/dL   Creatinine, Ser 1.28 (H) 0.61 - 1.24 mg/dL   Calcium 9.3 8.9 - 10.3 mg/dL   Total Protein 8.7 (H) 6.5 - 8.1 g/dL   Albumin 3.9 3.5 - 5.0  g/dL   AST 17 15 - 41 U/L   ALT 14 (L) 17 - 63 U/L   Alkaline Phosphatase 75 38 - 126 U/L   Total Bilirubin 0.9 0.3 - 1.2 mg/dL   GFR calc non Af Amer 57 (L) >60 mL/min   GFR calc Af Amer >60 >60 mL/min    Comment: (NOTE) The eGFR has been calculated using the CKD EPI equation. This calculation has not been validated in all clinical situations. eGFR's persistently <60 mL/min signify possible Chronic Kidney Disease.    Anion gap 12 5 - 15  Lipase, blood     Status: None   Collection Time: 11/13/16  9:36 PM  Result Value Ref Range   Lipase 13 11 - 51 U/L  CBC with Differential     Status: Abnormal   Collection Time: 11/13/16  9:36 PM  Result Value Ref Range   WBC 20.6 (H) 4.0 - 10.5 K/uL   RBC 4.76 4.22 - 5.81 MIL/uL   Hemoglobin 15.7 13.0 - 17.0 g/dL    HCT 43.7 39.0 - 52.0 %   MCV 91.8 78.0 - 100.0 fL   MCH 33.0 26.0 - 34.0 pg   MCHC 35.9 30.0 - 36.0 g/dL   RDW 12.9 11.5 - 15.5 %   Platelets 258 150 - 400 K/uL   Neutrophils Relative % 90 %   Neutro Abs 18.6 (H) 1.7 - 7.7 K/uL   Lymphocytes Relative 4 %   Lymphs Abs 0.9 0.7 - 4.0 K/uL   Monocytes Relative 5 %   Monocytes Absolute 1.0 0.1 - 1.0 K/uL   Eosinophils Relative 1 %   Eosinophils Absolute 0.1 0.0 - 0.7 K/uL   Basophils Relative 0 %   Basophils Absolute 0.0 0.0 - 0.1 K/uL  Troponin I     Status: None   Collection Time: 11/13/16  9:36 PM  Result Value Ref Range   Troponin I <0.03 <0.03 ng/mL  Urinalysis, Routine w reflex microscopic     Status: Abnormal   Collection Time: 11/13/16 11:28 PM  Result Value Ref Range   Color, Urine AMBER (A) YELLOW    Comment: BIOCHEMICALS MAY BE AFFECTED BY COLOR   APPearance HAZY (A) CLEAR   Specific Gravity, Urine 1.035 (H) 1.005 - 1.030   pH 5.0 5.0 - 8.0   Glucose, UA NEGATIVE NEGATIVE mg/dL   Hgb urine dipstick NEGATIVE NEGATIVE   Bilirubin Urine SMALL (A) NEGATIVE   Ketones, ur 5 (A) NEGATIVE mg/dL   Protein, ur 100 (A) NEGATIVE mg/dL   Nitrite NEGATIVE NEGATIVE   Leukocytes, UA NEGATIVE NEGATIVE   RBC / HPF 0-5 0 - 5 RBC/hpf   WBC, UA 0-5 0 - 5 WBC/hpf   Bacteria, UA RARE (A) NONE SEEN   Mucous PRESENT    Granular Casts, UA PRESENT   Lactic acid, plasma     Status: None   Collection Time: 11/14/16  1:35 AM  Result Value Ref Range   Lactic Acid, Venous 1.0 0.5 - 1.9 mmol/L  Lactic acid, plasma     Status: None   Collection Time: 11/14/16  5:52 AM  Result Value Ref Range   Lactic Acid, Venous 1.0 0.5 - 1.9 mmol/L  Basic metabolic panel     Status: Abnormal   Collection Time: 11/14/16  5:52 AM  Result Value Ref Range   Sodium 134 (L) 135 - 145 mmol/L   Potassium 3.8 3.5 - 5.1 mmol/L   Chloride 98 (L) 101 - 111 mmol/L  CO2 26 22 - 32 mmol/L   Glucose, Bld 127 (H) 65 - 99 mg/dL   BUN 14 6 - 20 mg/dL   Creatinine, Ser  1.12 0.61 - 1.24 mg/dL   Calcium 8.6 (L) 8.9 - 10.3 mg/dL   GFR calc non Af Amer >60 >60 mL/min   GFR calc Af Amer >60 >60 mL/min    Comment: (NOTE) The eGFR has been calculated using the CKD EPI equation. This calculation has not been validated in all clinical situations. eGFR's persistently <60 mL/min signify possible Chronic Kidney Disease.    Anion gap 10 5 - 15  CBC     Status: Abnormal   Collection Time: 11/14/16  5:52 AM  Result Value Ref Range   WBC 16.0 (H) 4.0 - 10.5 K/uL   RBC 4.22 4.22 - 5.81 MIL/uL   Hemoglobin 14.1 13.0 - 17.0 g/dL   HCT 38.9 (L) 39.0 - 52.0 %   MCV 92.2 78.0 - 100.0 fL   MCH 33.4 26.0 - 34.0 pg   MCHC 36.2 (H) 30.0 - 36.0 g/dL   RDW 13.0 11.5 - 15.5 %   Platelets 235 150 - 400 K/uL  Protime-INR     Status: None   Collection Time: 11/14/16  5:52 AM  Result Value Ref Range   Prothrombin Time 14.4 11.4 - 15.2 seconds   INR 1.11     Dg Chest 2 View  Result Date: 11/13/2016 CLINICAL DATA:  Abdominal pain extending into chest 3 days. EXAM: CHEST  2 VIEW COMPARISON:  None. FINDINGS: Lungs are adequately inflated without focal consolidation or effusion. Cardiomediastinal silhouette is within normal. There is mild degenerate change of the spine. IMPRESSION: No active cardiopulmonary disease. Electronically Signed   By: Marin Olp M.D.   On: 11/13/2016 21:59   Ct Abdomen Pelvis W Contrast  Result Date: 11/14/2016 CLINICAL DATA:  66 year old male with abdominal pain. EXAM: CT ABDOMEN AND PELVIS WITH CONTRAST TECHNIQUE: Multidetector CT imaging of the abdomen and pelvis was performed using the standard protocol following bolus administration of intravenous contrast. CONTRAST:  122m ISOVUE-300 IOPAMIDOL (ISOVUE-300) INJECTION 61% COMPARISON:  None. FINDINGS: Lower chest: The visualized lung bases are clear. Scattered pockets of free air noted in the upper abdomen and within the pelvis. Small subhepatic free fluid. Hepatobiliary: Apparent fatty infiltration  of the liver. No intrahepatic biliary ductal dilatation. The gallbladder is distended. No calcified gallstones noted. Pancreas: Unremarkable. No pancreatic ductal dilatation or surrounding inflammatory changes. Spleen: Normal in size without focal abnormality. Adrenals/Urinary Tract: The adrenal glands, kidneys, and the visualized ureters appear unremarkable. The urinary bladder is predominantly collapsed. There is diffuse thickened and hazy appearance of the bladder wall likely combination of underdistention and reactive inflammation. Correlation with urinalysis recommended to exclude cystitis. Stomach/Bowel: There is inflammatory changes with pockets of extraluminal air centered in the right lower quadrant in the region of the appendix. The appendix is only partially visualized. The base and proximal portion of the appendix appear unremarkable. The distal portion of the appendix extends into the inflammatory process cannot well visualized. The constellation of findings most suspicious for a ruptured acute appendicitis. Multiple small loculated fluid collections noted in the right lower quadrant. No discrete drainable fluid collection with organized wall identified at this time. Thickened and inflamed loops of small bowel in the right hemipelvis likely reactive inflammation. Multiple mildly dilated fluid-filled loops of small bowel throughout the abdomen most consistent with ileus. There is sigmoid diverticulosis with muscular hypertrophy. No active inflammatory changes. No  definite evidence of bowel obstruction Vascular/Lymphatic: There is mild aortoiliac atherosclerotic disease. The origins of the celiac axis, SMA, IMA as well as the origins of the renal arteries are patent. The SMV, splenic vein, and main portal vein are patent. No portal venous gas identified. Mildly enlarged peripancreatic and periportal lymph nodes, nonspecific, likely reactive. Reproductive: The prostate and seminal vesicles are grossly  unremarkable. Other: Small fat containing umbilical hernia. Musculoskeletal: Bilateral L5 pars defects with grade 1 L5-S1 anterolisthesis. Mild multilevel degenerative changes. No acute fracture. IMPRESSION: Findings most consistent with perforated acute appendicitis with pneumoperitoneum and small free fluid. Multiple small loculated fluid collection in the region of the appendix noted. No drainable fluid collection identified at this time. Clinical correlation follow-up recommended. Thickening of the small bowel in the right lower abdomen as well as mild dilatation of the loops of small bowel most consistent with reactive enteritis and ileus. Sigmoid diverticulosis. These results were called by telephone at the time of interpretation on 11/14/2016 at 12:05 am to Dr. Francine Graven , who verbally acknowledged these results. Electronically Signed   By: Anner Crete M.D.   On: 11/14/2016 00:07    ROS:  Pertinent items noted in HPI and remainder of comprehensive ROS otherwise negative.  Blood pressure 137/86, pulse 77, temperature 98.4 F (36.9 C), temperature source Oral, resp. rate 18, height _0  (1.803 m), weight 91.8 kg (202 lb 6.1 oz), SpO2 99 %. Physical Exam: Pleasant white male who is sitting on the side bed with mild to moderate abdominal discomfort noted. Head is normocephalic, atraumatic. Neck is supple without JVD Lungs clear to auscultation with breath sounds bilaterally. Heart examination reveals a regular rate and rhythm without S3, S4, murmurs. Abdomen is soft with nonspecific tenderness along the lower abdomen. No hepatosplenomegaly, masses, or rigidity are noted. CT scan images reviewed. History and physical examination reviewed.  Assessment/Plan: Impression: Perforated appendicitis. Leukocytosis starting to normalize. Patient's pain may be difficult to control due to history of narcotic use. No need for acute surgical intervention at this time. Will transfer patient to my  service. We'll start clear liquid diet.  Trudy Kory A 11/14/2016, 9:09 AM

## 2016-11-14 NOTE — Progress Notes (Signed)
Pharmacy Antibiotic Note  Lanelle BalFrederick Cohenour is a 66 y.o. male admitted on 11/13/2016 with intra-abdominal infection.  Pharmacy has been consulted for ZOSYN dosing.  Plan: Zosyn 3.375g IV q8h (4 hour infusion).  Monitor labs, progress, c/s  Height: 5\' 11"  (180.3 cm) Weight: 202 lb 6.1 oz (91.8 kg) IBW/kg (Calculated) : 75.3  Temp (24hrs), Avg:98.9 F (37.2 C), Min:98.4 F (36.9 C), Max:99.7 F (37.6 C)   Recent Labs Lab 11/13/16 2136 11/14/16 0135 11/14/16 0552  WBC 20.6*  --  16.0*  CREATININE 1.28*  --  1.12  LATICACIDVEN  --  1.0 1.0    Estimated Creatinine Clearance: 76.2 mL/min (by C-G formula based on SCr of 1.12 mg/dL).    Allergies  Allergen Reactions  . Codeine Hives   Antimicrobials this admission: Zosyn 1/27 >>   Dose adjustments this admission:  Microbiology results:  MRSA PCR:   Thank you for allowing pharmacy to be a part of this patient's care.  Valrie HartHall, Onie Hayashi A 11/14/2016 9:32 AM

## 2016-11-15 ENCOUNTER — Inpatient Hospital Stay (HOSPITAL_COMMUNITY): Payer: Medicare Other

## 2016-11-15 LAB — BASIC METABOLIC PANEL
Anion gap: 12 (ref 5–15)
BUN: 13 mg/dL (ref 6–20)
CALCIUM: 8.5 mg/dL — AB (ref 8.9–10.3)
CO2: 24 mmol/L (ref 22–32)
CREATININE: 0.92 mg/dL (ref 0.61–1.24)
Chloride: 98 mmol/L — ABNORMAL LOW (ref 101–111)
GFR calc Af Amer: >60 mL/min (ref >60)
GLUCOSE: 139 mg/dL — AB (ref 65–99)
Potassium: 3.7 mmol/L (ref 3.5–5.1)
Sodium: 134 mmol/L — ABNORMAL LOW (ref 135–145)

## 2016-11-15 LAB — CBC
HEMATOCRIT: 40.1 % (ref 39.0–52.0)
Hemoglobin: 14 g/dL (ref 13.0–17.0)
MCH: 31.7 pg (ref 26.0–34.0)
MCHC: 34.9 g/dL (ref 30.0–36.0)
MCV: 90.7 fL (ref 78.0–100.0)
PLATELETS: 269 10*3/uL (ref 150–400)
RBC: 4.42 MIL/uL (ref 4.22–5.81)
RDW: 12.8 % (ref 11.5–15.5)
WBC: 15.2 10*3/uL — ABNORMAL HIGH (ref 4.0–10.5)

## 2016-11-15 MED ORDER — CYCLOBENZAPRINE HCL 10 MG PO TABS
10.0000 mg | ORAL_TABLET | Freq: Three times a day (TID) | ORAL | Status: DC
  Administered 2016-11-15 – 2016-11-18 (×9): 10 mg via ORAL
  Filled 2016-11-15 (×9): qty 1

## 2016-11-15 MED ORDER — ALUM & MAG HYDROXIDE-SIMETH 200-200-20 MG/5ML PO SUSP: 30.0000 mL | Freq: Four times a day (QID) | ORAL | Status: DC | PRN

## 2016-11-15 MED ORDER — HYDROMORPHONE HCL 1 MG/ML IJ SOLN
2.0000 mg | INTRAMUSCULAR | Status: DC | PRN
  Administered 2016-11-15 (×2): 2 mg via INTRAVENOUS
  Filled 2016-11-15 (×2): qty 2

## 2016-11-15 MED ORDER — SIMETHICONE 80 MG PO CHEW
80.0000 mg | CHEWABLE_TABLET | Freq: Four times a day (QID) | ORAL | Status: DC | PRN
Start: 2016-11-15 — End: 2016-11-18

## 2016-11-15 MED ORDER — PANTOPRAZOLE SODIUM 40 MG PO TBEC
40.0000 mg | DELAYED_RELEASE_TABLET | Freq: Every day | ORAL | Status: DC
  Administered 2016-11-15 – 2016-11-17 (×3): 40 mg via ORAL
  Filled 2016-11-15 (×5): qty 1

## 2016-11-15 MED ORDER — FENTANYL CITRATE (PF) 100 MCG/2ML IJ SOLN
100.0000 ug | INTRAMUSCULAR | Status: DC | PRN
  Administered 2016-11-15 – 2016-11-18 (×19): 100 ug via INTRAVENOUS
  Filled 2016-11-15 (×19): qty 2

## 2016-11-15 NOTE — Care Management Note (Signed)
Case Management Note  Patient Details  Name: Lanelle BalFrederick Quist MRN: [id number] Date of Birth: [birth year]  Subjective/Objective:       Patient adm from home with acute perforated appendicitis. He is ind with ADL's, has cane PTA. [facility]. [facility] get [facility] through [facility]. May need MATCH voucher prior to discharge.              Action/Plan: CM following for needs.   Expected Discharge Date:       11/15/2016           Expected Discharge Plan:  Home/Self Care  In-House Referral:  NA  Discharge planning Services  CM Consult  Post Acute Care Choice:  NA Choice offered to:  NA  DME Arranged:    DME Agency:     HH Arranged:    HH Agency:     Status of Service:  In process, will continue to follow  If discussed at Long Length of Stay Meetings, dates discussed:    Additional Comments:  Marrion Finan, Chrystine OilerSharley Diane, RN 11/15/2016, 11:15 AM

## 2016-11-15 NOTE — Progress Notes (Signed)
Pt complaining of increasing pain and tenderness to abdomen. Pt states he had "two sips of milk and a bite of jello and all of a sudden he started hurting even more" Dr. Lovell SheehanJenkins has been verbally informed. Wife has been informed that Dr. Lovell SheehanJenkins is on his way to see patient.

## 2016-11-15 NOTE — Progress Notes (Signed)
Subjective: Patient still has right-sided abdominal pain, though it is mildly better. He is passing gas and moving his bowels. He does complain of indigestion.  Objective: Vital signs in last 24 hours: Temp:  [98.7 F (37.1 C)-99.6 F (37.6 C)] 98.7 F (37.1 C) (01/29 0644) Pulse Rate:  [75-87] 75 (01/29 0644) Resp:  [18] 18 (01/29 0644) BP: (146-156)/(71) 156/71 (01/29 0644) SpO2:  [91 %-94 %] 91 % (01/29 0644) Weight:  [94.9 kg (209 lb 3.5 oz)] 94.9 kg (209 lb 3.5 oz) (01/29 0644) Last BM Date: 11/14/16  Intake/Output from previous day: 01/28 0701 - 01/29 0700 In: 1178.3 [I.V.:1078.3; IV Piggyback:100] Out: 200 [Urine:200] Intake/Output this shift: No intake/output data recorded.  General appearance: alert, cooperative, no distress and Slightly anxious Resp: clear to auscultation bilaterally Cardio: regular rate and rhythm, S1, S2 normal, no murmur, click, rub or gallop GI: Soft with moderate tenderness to palpation in the right lower quadrant. No left-sided abdominal pain. No rigidity is noted. Bowel sounds are active.  Lab Results:   Recent Labs  11/14/16 0552 11/15/16 0808  WBC 16.0* 15.2*  HGB 14.1 14.0  HCT 38.9* 40.1  PLT 235 269   BMET  Recent Labs  11/14/16 0552 11/15/16 0808  NA 134* 134*  K 3.8 3.7  CL 98* 98*  CO2 26 24  GLUCOSE 127* 139*  BUN 14 13  CREATININE 1.12 0.92  CALCIUM 8.6* 8.5*   PT/INR  Recent Labs  11/14/16 0552  LABPROT 14.4  INR 1.11    Studies/Results: Dg Chest 2 View  Result Date: 11/13/2016 CLINICAL DATA:  Abdominal pain extending into chest 3 days. EXAM: CHEST  2 VIEW COMPARISON:  None. FINDINGS: Lungs are adequately inflated without focal consolidation or effusion. Cardiomediastinal silhouette is within normal. There is mild degenerate change of the spine. IMPRESSION: No active cardiopulmonary disease. Electronically Signed   By: Elberta Fortisaniel  Boyle M.D.   On: 11/13/2016 21:59   Ct Abdomen Pelvis W Contrast  Result  Date: 11/14/2016 CLINICAL DATA:  66 year old male with abdominal pain. EXAM: CT ABDOMEN AND PELVIS WITH CONTRAST TECHNIQUE: Multidetector CT imaging of the abdomen and pelvis was performed using the standard protocol following bolus administration of intravenous contrast. CONTRAST:  100mL ISOVUE-300 IOPAMIDOL (ISOVUE-300) INJECTION 61% COMPARISON:  None. FINDINGS: Lower chest: The visualized lung bases are clear. Scattered pockets of free air noted in the upper abdomen and within the pelvis. Small subhepatic free fluid. Hepatobiliary: Apparent fatty infiltration of the liver. No intrahepatic biliary ductal dilatation. The gallbladder is distended. No calcified gallstones noted. Pancreas: Unremarkable. No pancreatic ductal dilatation or surrounding inflammatory changes. Spleen: Normal in size without focal abnormality. Adrenals/Urinary Tract: The adrenal glands, kidneys, and the visualized ureters appear unremarkable. The urinary bladder is predominantly collapsed. There is diffuse thickened and hazy appearance of the bladder wall likely combination of underdistention and reactive inflammation. Correlation with urinalysis recommended to exclude cystitis. Stomach/Bowel: There is inflammatory changes with pockets of extraluminal air centered in the right lower quadrant in the region of the appendix. The appendix is only partially visualized. The base and proximal portion of the appendix appear unremarkable. The distal portion of the appendix extends into the inflammatory process cannot well visualized. The constellation of findings most suspicious for a ruptured acute appendicitis. Multiple small loculated fluid collections noted in the right lower quadrant. No discrete drainable fluid collection with organized wall identified at this time. Thickened and inflamed loops of small bowel in the right hemipelvis likely reactive inflammation. Multiple mildly dilated  fluid-filled loops of small bowel throughout the abdomen  most consistent with ileus. There is sigmoid diverticulosis with muscular hypertrophy. No active inflammatory changes. No definite evidence of bowel obstruction Vascular/Lymphatic: There is mild aortoiliac atherosclerotic disease. The origins of the celiac axis, SMA, IMA as well as the origins of the renal arteries are patent. The SMV, splenic vein, and main portal vein are patent. No portal venous gas identified. Mildly enlarged peripancreatic and periportal lymph nodes, nonspecific, likely reactive. Reproductive: The prostate and seminal vesicles are grossly unremarkable. Other: Small fat containing umbilical hernia. Musculoskeletal: Bilateral L5 pars defects with grade 1 L5-S1 anterolisthesis. Mild multilevel degenerative changes. No acute fracture. IMPRESSION: Findings most consistent with perforated acute appendicitis with pneumoperitoneum and small free fluid. Multiple small loculated fluid collection in the region of the appendix noted. No drainable fluid collection identified at this time. Clinical correlation follow-up recommended. Thickening of the small bowel in the right lower abdomen as well as mild dilatation of the loops of small bowel most consistent with reactive enteritis and ileus. Sigmoid diverticulosis. These results were called by telephone at the time of interpretation on 11/14/2016 at 12:05 am to Dr. Samuel Jester , who verbally acknowledged these results. Electronically Signed   By: Elgie Collard M.D.   On: 11/14/2016 00:07    Anti-infectives: Anti-infectives    Start     Dose/Rate Route Frequency Ordered Stop   11/14/16 0800  piperacillin-tazobactam (ZOSYN) IVPB 3.375 g     3.375 g 12.5 mL/hr over 240 Minutes Intravenous Every 8 hours 11/14/16 0739     11/14/16 0015  piperacillin-tazobactam (ZOSYN) IVPB 3.375 g     3.375 g 100 mL/hr over 30 Minutes Intravenous  Once 11/14/16 0008 11/14/16 0133      Assessment/Plan: Impression: Perforated appendicitis. He has not  developed worsening peritonitis. His leukocytosis is slowly resolving. Pain management will be an issue with this patient as he has been on narcotics chronically for low back pain. We will continue current management. Anticipate discharge in next 24-48 hours.  LOS: 1 day    Kaelyn Innocent A 11/15/2016

## 2016-11-16 LAB — CBC
HEMATOCRIT: 42.7 % (ref 39.0–52.0)
HEMOGLOBIN: 14.9 g/dL (ref 13.0–17.0)
MCH: 31.6 pg (ref 26.0–34.0)
MCHC: 34.9 g/dL (ref 30.0–36.0)
MCV: 90.5 fL (ref 78.0–100.0)
Platelets: 305 10*3/uL (ref 150–400)
RBC: 4.72 MIL/uL (ref 4.22–5.81)
RDW: 13 % (ref 11.5–15.5)
WBC: 13.2 10*3/uL — ABNORMAL HIGH (ref 4.0–10.5)

## 2016-11-16 NOTE — Progress Notes (Signed)
SURGICAL PROGRESS NOTE (cpt [phone])  Hospital Day(s): 2.   Post op day(s):  Marland Kitchen.   Interval History: Patient seen and examined, no acute events or new complaints overnight. Patient reports he for the first time today feels noticeably better with decreased abdominal pain (though not entirely resolved) and +flatus, small BM's x2, denies fever/chills, CP, or SOB.  Review of Systems:  Constitutional: denies fever, chills  HEENT: denies cough or congestion  Respiratory: denies any shortness of breath  Cardiovascular: denies chest pain or palpitations  Gastrointestinal: abdominal pain, N/V, and bowel function as per interval history Genitourinary: denies burning with urination or urinary frequency Musculoskeletal: denies pain, decreased motor or sensation Integumentary: denies any other rashes or skin discolorations Neurological: denies HA or vision/hearing changes   Vital signs in last 24 hours: [min-max] current  Temp:  [98.1 F (36.7 C)] 98.1 F (36.7 C) (01/29 2000) Pulse Rate:  [80-91] 80 (01/30 0700) Resp:  [18-20] 20 (01/30 0700) BP: (139-163)/(60-75) 163/75 (01/30 0700) SpO2:  [92 %-95 %] 95 % (01/30 0700) Weight:  [93.6 kg (206 lb 5.6 oz)] 93.6 kg (206 lb 5.6 oz) (01/30 0700)     Height: 5\' 11"  (180.3 cm) Weight: 93.6 kg (206 lb 5.6 oz) BMI (Calculated): 28.3   Intake/Output this shift:  No intake/output data recorded.   Intake/Output last 2 shifts:  @IOLAST2SHIFTS @   Physical Exam:  Constitutional: alert, cooperative and no distress  HENT: normocephalic without obvious abnormality  Eyes: PERRL, EOM's grossly intact and symmetric  Neuro: CN II - XII grossly intact and symmetric without deficit  Respiratory: breathing non-labored at rest  Cardiovascular: regular rate and sinus rhythm  Gastrointestinal: soft, mild lower abdominal tenderness to palpation, and non-distended  Musculoskeletal: UE and LE FROM, no edema or wounds, motor and sensation grossly intact, NT  Labs:   CBC:  Lab Results  Component Value Date   WBC 13.2 (H) 11/16/2016   RBC 4.72 11/16/2016   BMP:  Lab Results  Component Value Date   GLUCOSE 139 (H) 11/15/2016   CO2 24 11/15/2016   BUN 13 11/15/2016   CREATININE 0.92 11/15/2016   CALCIUM 8.5 (L) 11/15/2016     Imaging studies: No new pertinent imaging studies   Assessment/Plan: (ICD-10's: K35.2) 66 y.o. male with decreasing abdominal pain and improving leukocytosis attributed to perforated appendicitis, complicated by pertinent comorbidities including HTN, chronic back pain, osteoarthritis, tobacco abuse, generalized anxiety disorder, and GERD.   - pain control prn, minimize narcotics   - continue IV antibiotics, check/trend am CBC   - ambulation and smoking cessation encouraged  - discharge planning soon with PO antibiotics   All of the above findings and recommendations were discussed with the patient and his wife, and all of patient's and family's questions were answered to their expressed satisfaction.  -- Scherrie GerlachJason E. Earlene Plateravis, MD, RPVI [place]: [facility] Office: [phone]

## 2016-11-17 NOTE — Progress Notes (Signed)
SURGICAL PROGRESS NOTE (cpt [phone])  Hospital Day(s): 3.   Post op day(s):  Marland Kitchen.   Interval History: Patient seen and examined, no acute events or new complaints overnight. Patient reports his abdominal pain continues to improve with +flatus and +BM, and he's been tolerating his diet, denies fever/chills, N/V, CP, or SOB, though he reports poor sleep overnight attributed to his IV pump beeping throughout the night and discomfort attributed to his bed.  Review of Systems:  Constitutional: denies fever, chills  HEENT: denies cough or congestion  Respiratory: denies any shortness of breath  Cardiovascular: denies chest pain or palpitations  Gastrointestinal: abdominal pain, N/V, and bowel function as per interval history Genitourinary: denies burning with urination or urinary frequency Musculoskeletal: denies pain, decreased motor or sensation Integumentary: denies any other rashes or skin discolorations Neurological: denies HA or vision/hearing changes   Vital signs in last 24 hours: [min-max] current  Temp:  [98 F (36.7 C)-98.6 F (37 C)] 98.6 F (37 C) (01/31 0542) Pulse Rate:  [80-85] 85 (01/31 0542) Resp:  [18] 18 (01/31 0542) BP: (133-164)/(72-76) 133/72 (01/31 0542) SpO2:  [92 %-93 %] 93 % (01/31 0542) Weight:  [93.4 kg (205 lb 14.6 oz)] 93.4 kg (205 lb 14.6 oz) (01/31 0542)     Height: 5\' 11"  (180.3 cm) Weight: 93.4 kg (205 lb 14.6 oz) BMI (Calculated): 28.3   Intake/Output this shift:  No intake/output data recorded.   Intake/Output last 2 shifts:  @IOLAST2SHIFTS @   Physical Exam:  Constitutional: alert, cooperative and no distress  HENT: normocephalic without obvious abnormality  Eyes: PERRL, EOM's grossly intact and symmetric  Neuro: CN II - XII grossly intact and symmetric without deficit  Respiratory: breathing non-labored at rest  Cardiovascular: regular rate and sinus rhythm  Gastrointestinal: soft, mild RLQ abdominal tenderness to palpation, non-distended   Musculoskeletal: UE and LE FROM, motor and sensation grossly intact, NT   Labs:  CBC Latest Ref Rng & Units 11/16/2016 11/15/2016 11/14/2016  WBC 4.0 - 10.5 K/uL 13.2(H) 15.2(H) 16.0(H)  Hemoglobin 13.0 - 17.0 g/dL 52.814.9 41.314.0 24.414.1  Hematocrit 39.0 - 52.0 % 42.7 40.1 38.9(L)  Platelets 150 - 400 K/uL 305 269 235    Imaging studies: No new pertinent imaging studies   Assessment/Plan: (ICD-10's: K35.2) 66 y.o. male with decreasing abdominal pain and improving leukocytosis attributed to perforated appendicitis, complicated by pertinent comorbidities including HTN, chronic back pain, osteoarthritis, tobacco abuse, generalized anxiety disorder, and GERD.   - advance diet as tolerated             - pain control prn, minimize narcotics              - continue IV antibiotics and check am WBC             - discharge planning for likely tomorrow with PO antibiotics             - ambulation and smoking cessation encouraged  All of the above findings and recommendations were discussed with the patient and his wife, and all of patient's and family's questions were answered to their expressed satisfaction.  -- Scherrie GerlachJason E. Earlene Plateravis, MD, RPVI [place]: [facility] Office: [phone]

## 2016-11-17 NOTE — Progress Notes (Signed)
Pt in room with call light within reach. Nothing needed at this time. 

## 2016-11-17 NOTE — Progress Notes (Signed)
Pt sleeping at this time. Call light within reach. Nothing needed at this time.

## 2016-11-17 NOTE — Progress Notes (Signed)
Pharmacy Antibiotic Note  Daniel [name] is a 66 y.o. male admitted on 11/13/2016 with intra-abdominal infection.  Pharmacy has been consulted for ZOSYN dosing.  Plan: Zosyn 3.375g IV q8h (4 hour infusion).  Monitor labs, progress, c/s Deescalate ABX when appropriate  Height: 5\' 11"  (180.3 cm) Weight: 205 lb 14.6 oz (93.4 kg) IBW/kg (Calculated) : 75.3  Temp (24hrs), Avg:98.3 F (36.8 C), Min:98 F (36.7 C), Max:98.6 F (37 C)   Recent Labs Lab 11/13/16 2136 11/14/16 0135 11/14/16 0552 11/15/16 0808 11/16/16 0437  WBC 20.6*  --  16.0* 15.2* 13.2*  CREATININE 1.28*  --  1.12 0.92  --   LATICACIDVEN  --  1.0 1.0  --   --     Estimated Creatinine Clearance: 93.4 mL/min (by C-G formula based on SCr of 0.92 mg/dL).    Allergies  Allergen Reactions  . Codeine Hives   Antimicrobials this admission: Zosyn 1/27 >>   Dose adjustments this admission:  Microbiology results:  MRSA PCR:   Thank you for allowing pharmacy to be a part of this patient's care.  Valrie HartHall, [name] 11/17/2016 11:30 AM

## 2016-11-18 LAB — CBC
HCT: 39.5 % (ref 39.0–52.0)
HEMOGLOBIN: 13.8 g/dL (ref 13.0–17.0)
MCH: 31.7 pg (ref 26.0–34.0)
MCHC: 34.9 g/dL (ref 30.0–36.0)
MCV: 90.6 fL (ref 78.0–100.0)
Platelets: 373 10*3/uL (ref 150–400)
RBC: 4.36 MIL/uL (ref 4.22–5.81)
RDW: 13 % (ref 11.5–15.5)
WBC: 19.8 10*3/uL — ABNORMAL HIGH (ref 4.0–10.5)

## 2016-11-18 MED ORDER — AMOXICILLIN-POT CLAVULANATE 875-125 MG PO TABS: 1.0000 | ORAL_TABLET | Freq: Two times a day (BID) | ORAL | 0 refills | Status: AC

## 2016-11-18 MED ORDER — CYCLOBENZAPRINE HCL 10 MG PO TABS: 10.0000 mg | ORAL_TABLET | Freq: Three times a day (TID) | ORAL | 0 refills | Status: AC

## 2016-11-18 NOTE — Progress Notes (Signed)
Daniel [name] discharged Home per MD order.  Discharge instructions reviewed and discussed with the patient, all questions and concerns answered. Copy of instructions and scripts given to patient.  Allergies as of 11/18/2016      Reactions   Codeine Hives      Medication List    TAKE these medications   ALPRAZolam 0.5 MG tablet Commonly known as:  XANAX Take 0.5 mg by mouth 4 (four) times daily.   amLODipine 10 MG tablet Commonly known as:  NORVASC Take 5 mg by mouth daily.   amoxicillin-clavulanate 875-125 MG tablet Commonly known as:  AUGMENTIN Take 1 tablet by mouth 2 (two) times daily.   aspirin EC 81 MG tablet Take 81 mg by mouth daily.   cholecalciferol 1000 units tablet Commonly known as:  VITAMIN D Take 2,000 Units by mouth daily.   cyclobenzaprine 10 MG tablet Commonly known as:  FLEXERIL Take 10 mg by mouth 2 (two) times daily. What changed:  Another medication with the same name was added. Make sure you understand how and when to take each.   cyclobenzaprine 10 MG tablet Commonly known as:  FLEXERIL Take 1 tablet (10 mg total) by mouth 3 (three) times daily. What changed:  You were already taking a medication with the same name, and this prescription was added. Make sure you understand how and when to take each.   docusate sodium 100 MG capsule Commonly known as:  COLACE Take 100-300 mg by mouth daily as needed for mild constipation.   DULoxetine 60 MG capsule Commonly known as:  CYMBALTA Take 60 mg by mouth daily.   L-Lysine 500 MG Tabs Take 1 tablet by mouth daily.   morphine 60 MG 24 hr capsule Commonly known as:  KADIAN Take 60 mg by mouth 3 (three) times daily.   omeprazole 20 MG capsule Commonly known as:  PRILOSEC Take 20 mg by mouth 2 (two) times daily before a meal.   oxyCODONE-acetaminophen 5-325 MG tablet Commonly known as:  PERCOCET/ROXICET Take by mouth every 6 (six) hours as needed for severe pain.       Patients skin is clean,  dry and intact, no evidence of skin break down. IV site discontinued and catheter remains intact. Site without signs and symptoms of complications. Dressing and pressure applied.  Patient escorted to car by NT in a wheelchair,  no distress noted upon discharge.  Daniel KoyanagiBonnie M June [name] 11/18/2016 11:08 AM

## 2016-11-18 NOTE — Discharge Summary (Signed)
Physician Discharge Summary  Patient ID: Daniel [name] MRN: [id number] DOB/AGE: [birth year] 66 y.o.  Admit date: 11/13/2016 Discharge date: 11/18/2016  Admission Diagnoses:Acute perforated appendicitis  Discharge Diagnoses: Same  Principal Problem:   Acute perforated appendicitis Active Problems:   AKI (acute kidney injury) (HCC)   Abdominal pain, acute, generalized  Chronic back pain, chronic narcotic use, chronic tobacco use Discharged Condition: good  Hospital Course: Patient is a 66 year old white male who presented to the emergency room with a 4 day history of worsening right lower quadrant abdominal pain. CT scan of the abdomen revealed perforated acute appendicitis. He was admitted to the hospital for IV antibiotics. Pain control was difficult secondary to chronic narcotic use for back pain [facility] which [facility]. His leukocytosis did improve. He does have elevated white count this morning, but he feels much better and desires discharge.  Discharge Exam: Blood pressure (!) 120/58, pulse 77, temperature 97.9 F (36.6 C), temperature source Oral, resp. rate 18, height 5\' 11"  (1.803 m), weight 91.1 kg (200 lb 12.8 oz), SpO2 92 %. General appearance: alert, cooperative and no distress Resp: clear to auscultation bilaterally Cardio: regular rate and rhythm, S1, S2 normal, no murmur, click, rub or gallop GI: Soft with minimal tenderness in the right lower quadrant to deep palpation. No rigidity is noted.  Disposition: Home  Discharge Instructions    Increase activity slowly    Complete by:  As directed      Allergies as of 11/18/2016      Reactions   Codeine Hives      Medication List    TAKE these medications   ALPRAZolam 0.5 MG tablet Commonly known as:  XANAX Take 0.5 mg by mouth 4 (four) times daily.   amLODipine 10 MG tablet Commonly known as:  NORVASC Take 5 mg by mouth daily.   amoxicillin-clavulanate 875-125 MG tablet Commonly known as:   AUGMENTIN Take 1 tablet by mouth 2 (two) times daily.   aspirin EC 81 MG tablet Take 81 mg by mouth daily.   cholecalciferol 1000 units tablet Commonly known as:  VITAMIN D Take 2,000 Units by mouth daily.   cyclobenzaprine 10 MG tablet Commonly known as:  FLEXERIL Take 10 mg by mouth 2 (two) times daily. What changed:  Another medication with the same name was added. Make sure you understand how and when to take each.   cyclobenzaprine 10 MG tablet Commonly known as:  FLEXERIL Take 1 tablet (10 mg total) by mouth 3 (three) times daily. What changed:  You were already taking a medication with the same name, and this prescription was added. Make sure you understand how and when to take each.   docusate sodium 100 MG capsule Commonly known as:  COLACE Take 100-300 mg by mouth daily as needed for mild constipation.   DULoxetine 60 MG capsule Commonly known as:  CYMBALTA Take 60 mg by mouth daily.   L-Lysine 500 MG Tabs Take 1 tablet by mouth daily.   morphine 60 MG 24 hr capsule Commonly known as:  KADIAN Take 60 mg by mouth 3 (three) times daily.   omeprazole 20 MG capsule Commonly known as:  PRILOSEC Take 20 mg by mouth 2 (two) times daily before a meal.   oxyCODONE-acetaminophen 5-325 MG tablet Commonly known as:  PERCOCET/ROXICET Take by mouth every 6 (six) hours as needed for severe pain.      Follow-up Information    [facility] Follow up.  Contact information: [address] [place] [address] [phone]        Dalia Heading, MD. Schedule an appointment as soon as possible for a visit on 11/30/2016.   Specialty:  General Surgery Contact information: [address] [place] [address] [phone]           Signed: Franky Macho A 11/18/2016, 8:51 AM

## 2016-11-18 NOTE — Care Management Note (Addendum)
Case Management Note  Patient Details  Name: Lanelle BalFrederick Soderholm MRN: [id number] Date of Birth: [birth year]    Expected Discharge Date:  11/18/16               Expected Discharge Plan:  Home/Self Care  In-House Referral:  NA  Discharge planning Services  CM Consult  Post Acute Care Choice:  NA Choice offered to:  NA  DME Arranged:    DME Agency:     HH Arranged:    HH Agency:     Status of Service:  Completed, signed off  If discussed at [employer] Length of Stay Meetings, dates discussed:    Additional Comments: Patient discharging home today. Prescribed Augmentin and Flexeril. CM gave discount card for Augmentin, cost will be $25 dollars. Flexeril will be $ 10, no discount available. No other CM needs.  Rhian Funari, Chrystine OilerSharley Diane, RN 11/18/2016, 9:34 AM

## 2017-05-03 LAB — HM COLONOSCOPY

## 2018-08-13 IMAGING — CT CT ABD-PELV W/ CM
2 of 5 series · 15 of 46 positions shown, 17 images · IV contrast (Isovue)
Comparison: None.

CLINICAL DATA: 65-year-old male with abdominal pain.

EXAM:
CT ABDOMEN AND PELVIS WITH CONTRAST
TECHNIQUE: Multidetector CT imaging of the abdomen and pelvis was performed
using the standard protocol following bolus administration of
intravenous contrast.
CONTRAST:  100mL [id number] IOPAMIDOL ([id number]) INJECTION 61%

[Series 2: axial st · axial · 0.87mm/px · z∈[+850,+1250]mm · 12 of 92 slices shown, 14 images]
[im 6/92  soft-tissue]
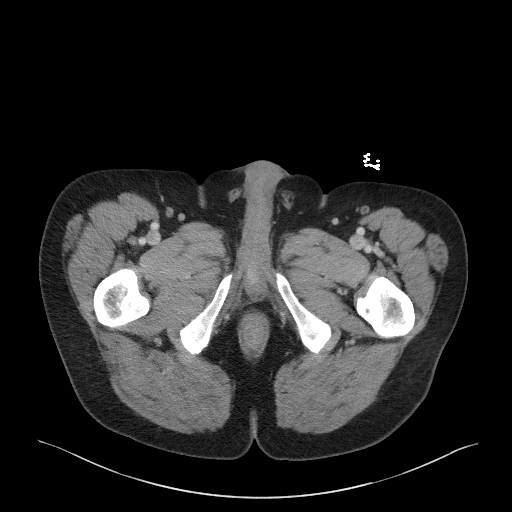
[im 6/92  bone]
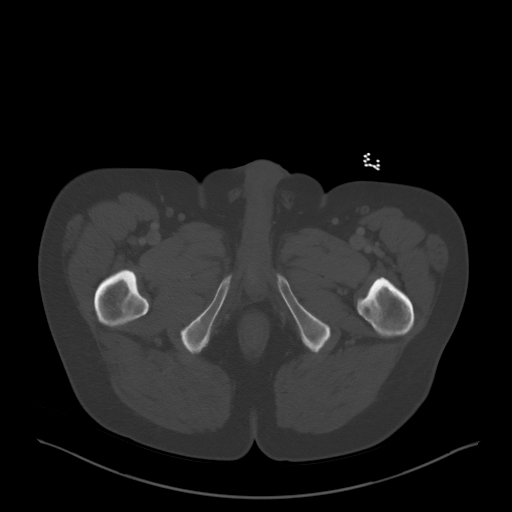
[im 16/92  soft-tissue]
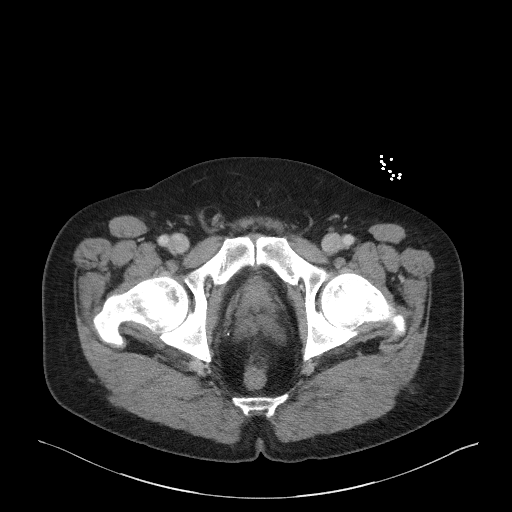
[im 21/92  soft-tissue]
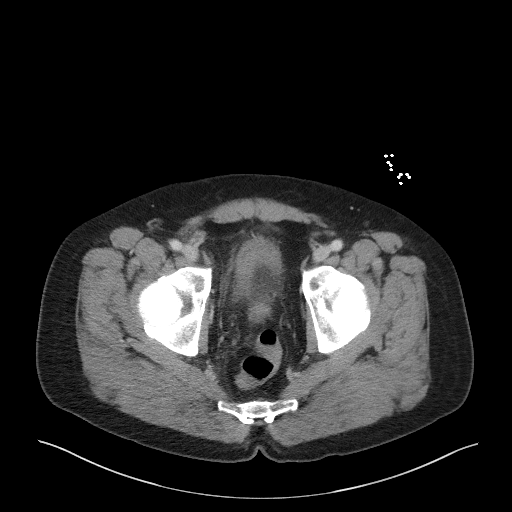
[im 26/92  soft-tissue]
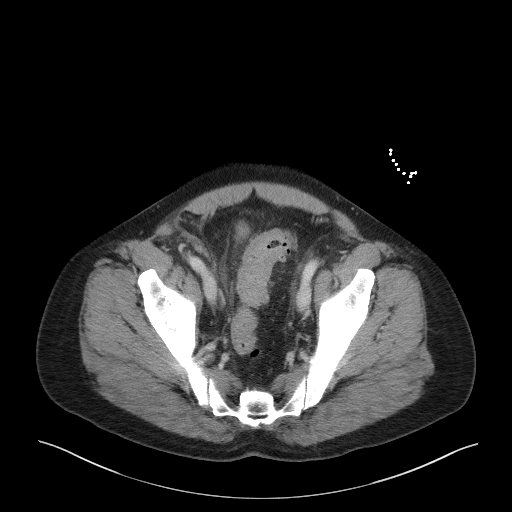
[im 36/92  soft-tissue]
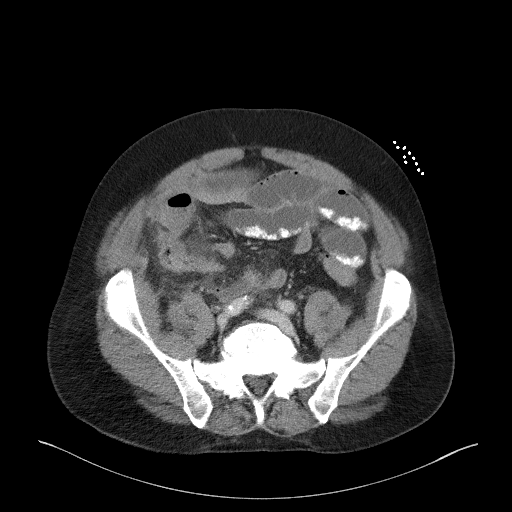
[im 41/92  soft-tissue]
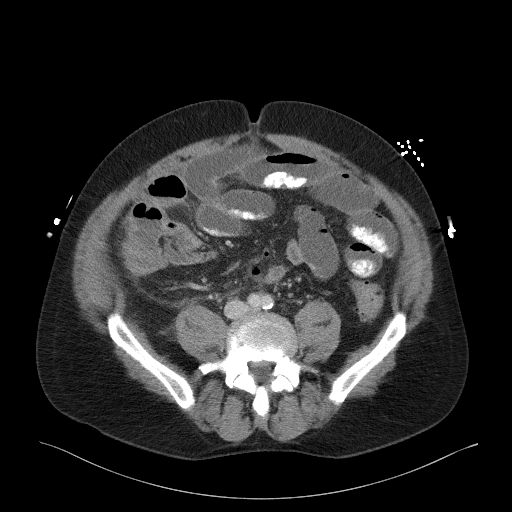
[im 51/92  soft-tissue]
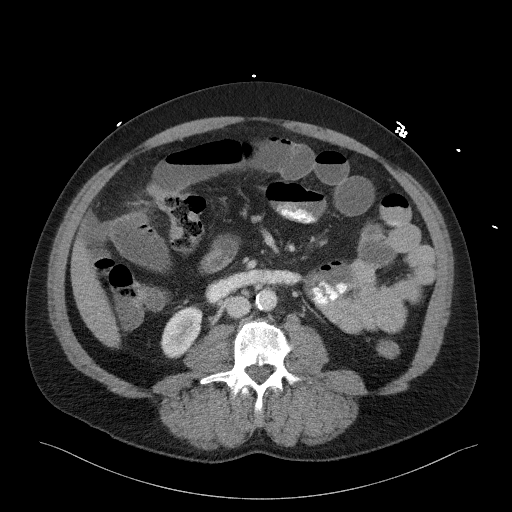
[im 56/92  soft-tissue]
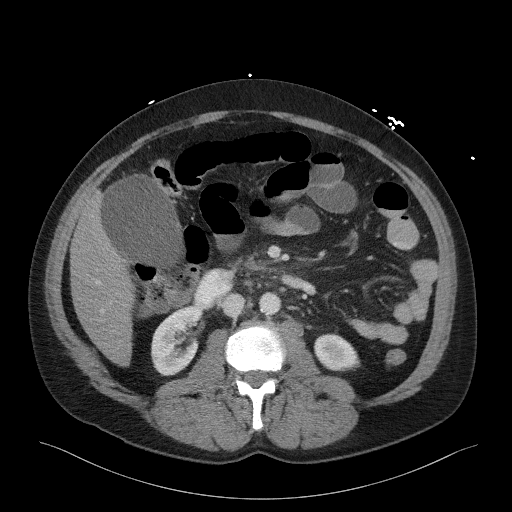
[im 66/92  soft-tissue]
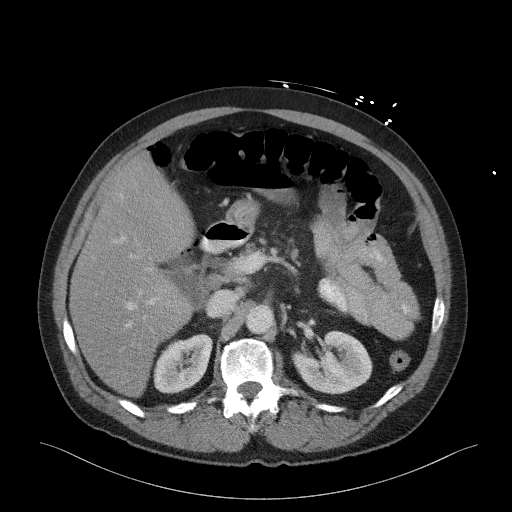
[im 66/92  bone]
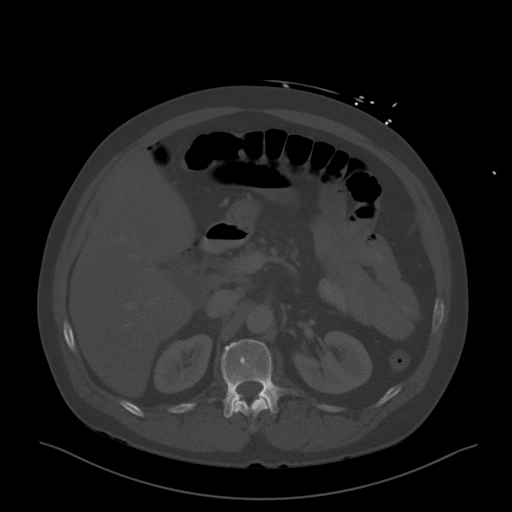
[im 71/92  soft-tissue]
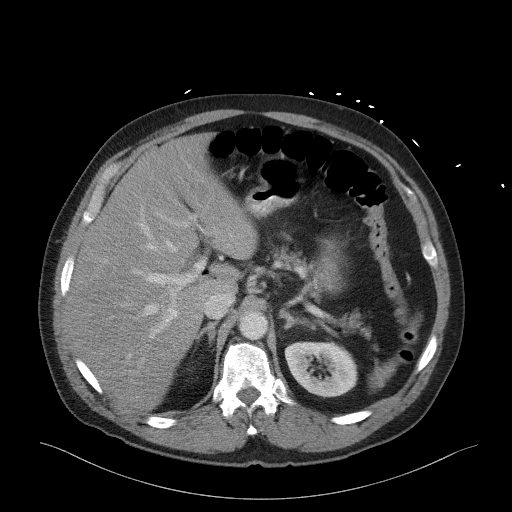
[im 76/92  soft-tissue]
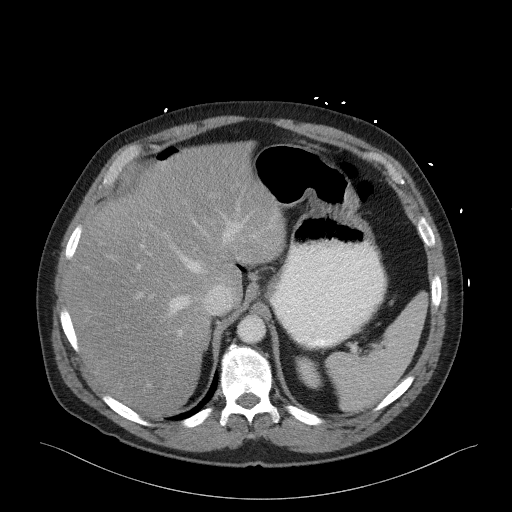
[im 86/92  soft-tissue]
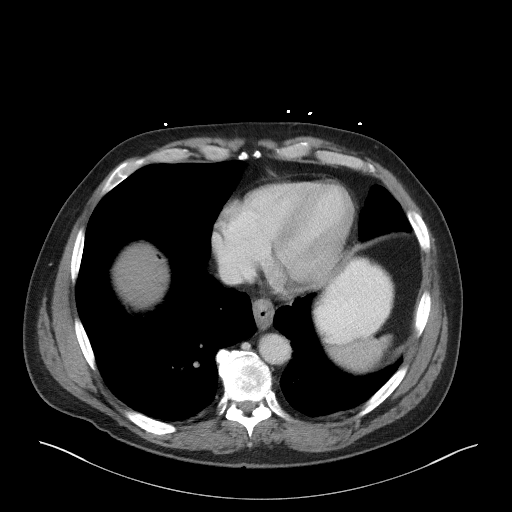

[Series 5: coronal st · coronal · 0.75mm/px · 3 of 110 slices shown]
[im 37/110  soft-tissue]
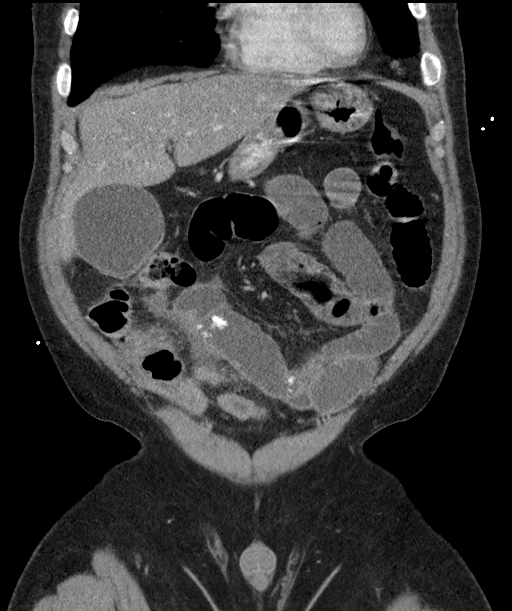
[im 49/110  soft-tissue]
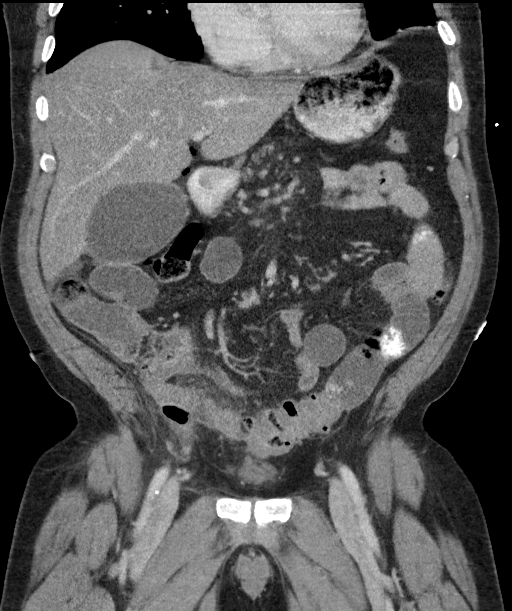
[im 61/110  soft-tissue]
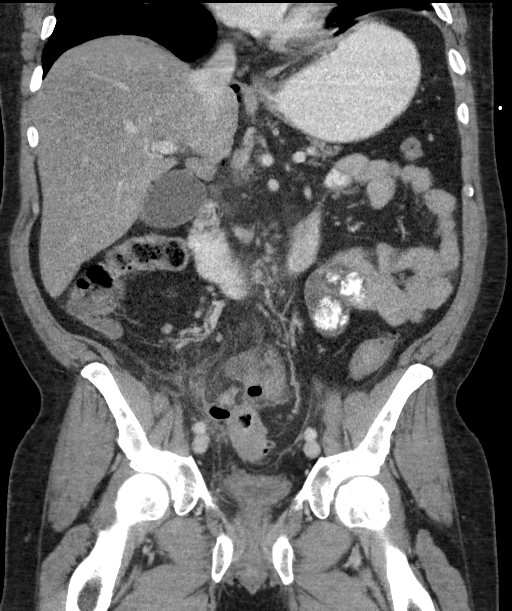

[15 of 46 positions shown; findings below may reference images not displayed]

FINDINGS: Lower chest: The visualized lung bases are clear.

Scattered pockets of free air noted in the upper abdomen and within
the pelvis. Small subhepatic free fluid.

Hepatobiliary: Apparent fatty infiltration of the liver. No
intrahepatic biliary ductal dilatation. The gallbladder is
distended. No calcified gallstones noted.

Pancreas: Unremarkable. No pancreatic ductal dilatation or
surrounding inflammatory changes.

Spleen: Normal in size without focal abnormality.

Adrenals/Urinary Tract: The adrenal glands, kidneys, and the
visualized ureters appear unremarkable. The urinary bladder is
predominantly collapsed. There is diffuse thickened and hazy
appearance of the bladder wall likely combination of underdistention
and reactive inflammation. Correlation with urinalysis recommended
to exclude cystitis.

Stomach/Bowel: There is inflammatory changes with pockets of
extraluminal air centered in the right lower quadrant in the region
of the appendix. The appendix is only partially visualized. The base
and proximal portion of the appendix appear unremarkable. The distal
portion of the appendix extends into the inflammatory process cannot
well visualized. The constellation of findings most suspicious for a
ruptured acute appendicitis. Multiple small loculated fluid
collections noted in the right lower quadrant. No discrete drainable
fluid collection with organized wall identified at this time.
Thickened and inflamed loops of small bowel in the right hemipelvis
likely reactive inflammation. Multiple mildly dilated fluid-filled
loops of small bowel throughout the abdomen most consistent with
ileus. There is sigmoid diverticulosis with muscular hypertrophy. No
active inflammatory changes. No definite evidence of bowel
obstruction

Vascular/Lymphatic: There is mild aortoiliac atherosclerotic
disease. The origins of the celiac axis, SMA, IMA as well as the
origins of the renal arteries are patent. The SMV, splenic vein, and
main portal vein are patent. No portal venous gas identified. Mildly
enlarged peripancreatic and periportal lymph nodes, nonspecific,
likely reactive.

Reproductive: The prostate and seminal vesicles are grossly
unremarkable.

Other: Small fat containing umbilical hernia.

Musculoskeletal: Bilateral L5 pars defects with grade 1 L5-S1
anterolisthesis. Mild multilevel degenerative changes. No acute
fracture.
IMPRESSION: Findings most consistent with perforated acute appendicitis with
pneumoperitoneum and small free fluid. Multiple small loculated
fluid collection in the region of the appendix noted. No drainable
fluid collection identified at this time. Clinical correlation
follow-up recommended.

Thickening of the small bowel in the right lower abdomen as well as
mild dilatation of the loops of small bowel most consistent with
reactive enteritis and ileus.

Sigmoid diverticulosis.

These results were called by telephone at the time of interpretation
on 11/14/2016 at [DATE] to Dr. DOSD AGIR , who verbally
acknowledged these results.

## 2024-11-23 ENCOUNTER — Encounter: Payer: Self-pay | Admitting: Internal Medicine
